# Patient Record
Sex: Female | Born: 1960 | Race: Black or African American | Hispanic: No | Marital: Single | Smoking: Never smoker
Health system: Southern US, Community
[De-identification: ages and names within clinical notes are randomized; demographics above are authoritative.]

## PROBLEM LIST (undated history)

## (undated) DIAGNOSIS — I10 Essential (primary) hypertension: Secondary | ICD-10-CM

## (undated) DIAGNOSIS — K589 Irritable bowel syndrome without diarrhea: Secondary | ICD-10-CM

## (undated) DIAGNOSIS — D369 Benign neoplasm, unspecified site: Secondary | ICD-10-CM

## (undated) DIAGNOSIS — T7840XA Allergy, unspecified, initial encounter: Secondary | ICD-10-CM

## (undated) DIAGNOSIS — N189 Chronic kidney disease, unspecified: Secondary | ICD-10-CM

## (undated) DIAGNOSIS — M1990 Unspecified osteoarthritis, unspecified site: Secondary | ICD-10-CM

## (undated) DIAGNOSIS — E785 Hyperlipidemia, unspecified: Secondary | ICD-10-CM

## (undated) DIAGNOSIS — H53009 Unspecified amblyopia, unspecified eye: Secondary | ICD-10-CM

## (undated) HISTORY — DX: Irritable bowel syndrome, unspecified: K58.9

## (undated) HISTORY — PX: KNEE ARTHROSCOPY: SUR90

## (undated) HISTORY — DX: Unspecified amblyopia, unspecified eye: H53.009

## (undated) HISTORY — DX: Hyperlipidemia, unspecified: E78.5

## (undated) HISTORY — DX: Benign neoplasm, unspecified site: D36.9

## (undated) HISTORY — PX: COLONOSCOPY: SHX174

## (undated) HISTORY — DX: Chronic kidney disease, unspecified: N18.9

## (undated) HISTORY — DX: Unspecified osteoarthritis, unspecified site: M19.90

## (undated) HISTORY — PX: POLYPECTOMY: SHX149

## (undated) HISTORY — DX: Allergy, unspecified, initial encounter: T78.40XA

---

## 1997-08-08 HISTORY — PX: CHOLECYSTECTOMY: SHX55

## 1998-03-17 ENCOUNTER — Ambulatory Visit (HOSPITAL_COMMUNITY): Admission: RE | Admit: 1998-03-17 | Discharge: 1998-03-18 | Payer: Self-pay | Admitting: General Surgery

## 1999-05-13 ENCOUNTER — Encounter: Payer: Self-pay | Admitting: Obstetrics and Gynecology

## 1999-05-13 ENCOUNTER — Inpatient Hospital Stay (HOSPITAL_COMMUNITY): Admission: AD | Admit: 1999-05-13 | Discharge: 1999-06-05 | Payer: Self-pay | Admitting: Obstetrics & Gynecology

## 1999-05-15 ENCOUNTER — Encounter: Payer: Self-pay | Admitting: Obstetrics and Gynecology

## 1999-05-17 ENCOUNTER — Encounter: Payer: Self-pay | Admitting: Obstetrics and Gynecology

## 1999-05-19 ENCOUNTER — Encounter: Payer: Self-pay | Admitting: Obstetrics and Gynecology

## 1999-05-20 ENCOUNTER — Encounter: Payer: Self-pay | Admitting: Obstetrics & Gynecology

## 1999-05-20 ENCOUNTER — Encounter: Payer: Self-pay | Admitting: Obstetrics and Gynecology

## 1999-05-22 ENCOUNTER — Encounter: Payer: Self-pay | Admitting: Obstetrics and Gynecology

## 1999-05-26 ENCOUNTER — Encounter: Payer: Self-pay | Admitting: Obstetrics & Gynecology

## 1999-05-29 ENCOUNTER — Encounter: Payer: Self-pay | Admitting: Obstetrics & Gynecology

## 1999-05-31 ENCOUNTER — Encounter: Payer: Self-pay | Admitting: Obstetrics & Gynecology

## 1999-06-06 ENCOUNTER — Encounter: Admission: RE | Admit: 1999-06-06 | Discharge: 1999-09-04 | Payer: Self-pay | Admitting: Obstetrics and Gynecology

## 2000-10-19 ENCOUNTER — Other Ambulatory Visit: Admission: RE | Admit: 2000-10-19 | Discharge: 2000-10-19 | Payer: Self-pay | Admitting: Obstetrics & Gynecology

## 2002-01-29 ENCOUNTER — Other Ambulatory Visit: Admission: RE | Admit: 2002-01-29 | Discharge: 2002-01-29 | Payer: Self-pay | Admitting: Obstetrics & Gynecology

## 2003-02-21 ENCOUNTER — Other Ambulatory Visit: Admission: RE | Admit: 2003-02-21 | Discharge: 2003-02-21 | Payer: Self-pay | Admitting: Obstetrics & Gynecology

## 2003-12-01 ENCOUNTER — Emergency Department (HOSPITAL_COMMUNITY): Admission: EM | Admit: 2003-12-01 | Discharge: 2003-12-01 | Payer: Self-pay | Admitting: Family Medicine

## 2004-03-23 ENCOUNTER — Other Ambulatory Visit: Admission: RE | Admit: 2004-03-23 | Discharge: 2004-03-23 | Payer: Self-pay | Admitting: Obstetrics & Gynecology

## 2005-04-18 ENCOUNTER — Other Ambulatory Visit: Admission: RE | Admit: 2005-04-18 | Discharge: 2005-04-18 | Payer: Self-pay | Admitting: Obstetrics & Gynecology

## 2007-01-09 ENCOUNTER — Emergency Department (HOSPITAL_COMMUNITY): Admission: EM | Admit: 2007-01-09 | Discharge: 2007-01-09 | Payer: Self-pay | Admitting: Emergency Medicine

## 2007-05-30 ENCOUNTER — Encounter: Admission: RE | Admit: 2007-05-30 | Discharge: 2007-05-30 | Payer: Self-pay | Admitting: Obstetrics & Gynecology

## 2007-08-09 DIAGNOSIS — D369 Benign neoplasm, unspecified site: Secondary | ICD-10-CM

## 2007-08-09 HISTORY — DX: Benign neoplasm, unspecified site: D36.9

## 2007-10-02 ENCOUNTER — Encounter: Admission: RE | Admit: 2007-10-02 | Discharge: 2007-10-02 | Payer: Self-pay | Admitting: Obstetrics & Gynecology

## 2007-12-14 ENCOUNTER — Emergency Department (HOSPITAL_COMMUNITY): Admission: EM | Admit: 2007-12-14 | Discharge: 2007-12-14 | Payer: Self-pay | Admitting: Family Medicine

## 2007-12-21 ENCOUNTER — Telehealth: Payer: Self-pay | Admitting: Internal Medicine

## 2008-01-23 DIAGNOSIS — M545 Low back pain: Secondary | ICD-10-CM

## 2008-01-23 DIAGNOSIS — M129 Arthropathy, unspecified: Secondary | ICD-10-CM | POA: Insufficient documentation

## 2008-01-23 DIAGNOSIS — R1012 Left upper quadrant pain: Secondary | ICD-10-CM | POA: Insufficient documentation

## 2008-01-24 ENCOUNTER — Ambulatory Visit: Payer: Self-pay | Admitting: Internal Medicine

## 2008-01-24 DIAGNOSIS — Z9089 Acquired absence of other organs: Secondary | ICD-10-CM

## 2008-02-14 ENCOUNTER — Ambulatory Visit: Payer: Self-pay | Admitting: Internal Medicine

## 2008-02-14 ENCOUNTER — Encounter: Payer: Self-pay | Admitting: Internal Medicine

## 2008-02-16 ENCOUNTER — Encounter: Payer: Self-pay | Admitting: Internal Medicine

## 2008-07-08 ENCOUNTER — Encounter: Admission: RE | Admit: 2008-07-08 | Discharge: 2008-07-08 | Payer: Self-pay | Admitting: Obstetrics & Gynecology

## 2009-07-28 ENCOUNTER — Encounter: Admission: RE | Admit: 2009-07-28 | Discharge: 2009-07-28 | Payer: Self-pay | Admitting: Obstetrics & Gynecology

## 2010-07-29 ENCOUNTER — Encounter
Admission: RE | Admit: 2010-07-29 | Discharge: 2010-07-29 | Payer: Self-pay | Source: Home / Self Care | Attending: Obstetrics & Gynecology | Admitting: Obstetrics & Gynecology

## 2010-08-29 ENCOUNTER — Encounter: Payer: Self-pay | Admitting: Obstetrics & Gynecology

## 2011-05-26 LAB — POCT URINALYSIS DIP (DEVICE)
Hgb urine dipstick: NEGATIVE
Operator id: 235561
Protein, ur: NEGATIVE
Specific Gravity, Urine: 1.01
Urobilinogen, UA: 0.2

## 2011-08-10 ENCOUNTER — Other Ambulatory Visit: Payer: Self-pay | Admitting: Obstetrics & Gynecology

## 2011-08-10 DIAGNOSIS — Z1231 Encounter for screening mammogram for malignant neoplasm of breast: Secondary | ICD-10-CM

## 2011-09-06 ENCOUNTER — Ambulatory Visit
Admission: RE | Admit: 2011-09-06 | Discharge: 2011-09-06 | Disposition: A | Discharge status: Home / Self Care | Payer: BC Managed Care – PPO | Source: Ambulatory Visit | Attending: Obstetrics & Gynecology | Admitting: Obstetrics & Gynecology

## 2011-09-06 DIAGNOSIS — Z1231 Encounter for screening mammogram for malignant neoplasm of breast: Secondary | ICD-10-CM

## 2011-09-30 ENCOUNTER — Emergency Department (INDEPENDENT_AMBULATORY_CARE_PROVIDER_SITE_OTHER)
Admission: EM | Admit: 2011-09-30 | Discharge: 2011-09-30 | Disposition: A | Discharge status: Home / Self Care | Payer: BC Managed Care – PPO | Source: Home / Self Care | Attending: Emergency Medicine | Admitting: Emergency Medicine

## 2011-09-30 ENCOUNTER — Encounter (HOSPITAL_COMMUNITY): Payer: Self-pay

## 2011-09-30 DIAGNOSIS — J329 Chronic sinusitis, unspecified: Secondary | ICD-10-CM

## 2011-09-30 HISTORY — DX: Essential (primary) hypertension: I10

## 2011-09-30 MED ORDER — AMOXICILLIN 500 MG PO CAPS: 500.0000 mg | ORAL_CAPSULE | Freq: Three times a day (TID) | ORAL | Status: AC

## 2011-09-30 NOTE — Discharge Instructions (Signed)

## 2011-09-30 NOTE — ED Notes (Signed)
C/o sharp stabbing pain in face, dental pain and thick,yellow nasal secretions

## 2011-09-30 NOTE — ED Provider Notes (Signed)
Marie Barker is a 51 y.o. female who presents to Urgent Care today for congestion maxillary sinus pain yellow nasal discharge tooth pain for one week. Symptoms initially started with cough and were resolving until 2 days ago when this pain nasal congestion and drainage started. This is consistent with previous episodes of sinus infections. Otherwise she feels well. She denies any difficulty breathing or abdominal pain.   PMH reviewed.  ROS as above otherwise neg Medications reviewed. No current facility-administered medications for this encounter.   Current Outpatient Prescriptions  Medication Sig Dispense Refill  . atenolol (TENORMIN) 50 MG tablet Take 50 mg by mouth daily.      Marland Kitchen amoxicillin (AMOXIL) 500 MG capsule Take 1 capsule (500 mg total) by mouth 3 (three) times daily.  21 capsule  0    Exam:  BP 147/80  Pulse 87  Temp(Src) 98.9 F (37.2 C) (Oral)  Resp 20  SpO2 100%  LMP 09/15/2011 Gen: Well NAD HEENT: EOMI,  MMM normal tympanic membranes bilaterally tender to palpation over left maxillary sinus Lungs: CTABL Nl WOB Heart: RRR no MRG Abd: NABS, NT, ND Exts: Non edematous BL  LE, warm and well perfused.   Assessment and Plan: 51 year old woman with likely sinusitis. Plan to treat with amoxicillin for one week. Handout provided. Presented to primary care provider if not improved in one week.     Clementeen Graham, MD 09/30/11 2053

## 2012-09-24 ENCOUNTER — Other Ambulatory Visit: Payer: Self-pay | Admitting: Obstetrics & Gynecology

## 2012-09-24 DIAGNOSIS — Z1231 Encounter for screening mammogram for malignant neoplasm of breast: Secondary | ICD-10-CM

## 2012-10-19 ENCOUNTER — Ambulatory Visit: Payer: BC Managed Care – PPO

## 2012-10-26 ENCOUNTER — Ambulatory Visit
Admission: RE | Admit: 2012-10-26 | Discharge: 2012-10-26 | Disposition: A | Discharge status: Home / Self Care | Payer: BC Managed Care – PPO | Source: Ambulatory Visit | Attending: Obstetrics & Gynecology | Admitting: Obstetrics & Gynecology

## 2012-10-26 DIAGNOSIS — Z1231 Encounter for screening mammogram for malignant neoplasm of breast: Secondary | ICD-10-CM

## 2012-11-30 ENCOUNTER — Encounter: Payer: Self-pay | Admitting: Internal Medicine

## 2012-12-03 ENCOUNTER — Encounter: Payer: Self-pay | Admitting: *Deleted

## 2013-01-22 ENCOUNTER — Encounter: Payer: Self-pay | Admitting: Internal Medicine

## 2013-01-22 ENCOUNTER — Ambulatory Visit (INDEPENDENT_AMBULATORY_CARE_PROVIDER_SITE_OTHER): Payer: BC Managed Care – PPO | Admitting: Internal Medicine

## 2013-01-22 VITALS — BP 100/72 | HR 60 | Ht 62.0 in | Wt 241.0 lb

## 2013-01-22 DIAGNOSIS — Z86010 Personal history of colonic polyps: Secondary | ICD-10-CM

## 2013-01-22 DIAGNOSIS — K589 Irritable bowel syndrome without diarrhea: Secondary | ICD-10-CM

## 2013-01-22 MED ORDER — PEG-KCL-NACL-NASULF-NA ASC-C 100 G PO SOLR: 1.0000 | Freq: Once | ORAL | Status: DC

## 2013-01-22 MED ORDER — DICYCLOMINE HCL 20 MG PO TABS: ORAL_TABLET | ORAL | Status: DC

## 2013-01-22 NOTE — Patient Instructions (Addendum)
You have been scheduled for a colonoscopy with propofol. Please follow written instructions given to you at your visit today.  Please pick up your prep kit at the pharmacy within the next 1-3 days. If you use inhalers (even only as needed), please bring them with you on the day of your procedure. Your physician has requested that you go to www.startemmi.com and enter the access code given to you at your visit today. This web site gives a general overview about your procedure. However, you should still follow specific instructions given to you by our office regarding your preparation for the procedure.  We have sent the following medications to your pharmacy for you to pick up at your convenience: Bentyl.  Start over the [employer] with one scoop with breakfast every day.  Dr Elmore Guise

## 2013-01-22 NOTE — Progress Notes (Signed)
Marie Barker [birth year] MRN [id number]        History of Present Illness:  This is a 52 year old African American female with the frequent diarrhea, urgent bowel movements occurring usually postprandially but also independent of meals. We have seen her in 2009 for screening colonoscopy which showed adenomatous polyps. She was diagnosed with irritable bowel syndrome and was given Bentyl 10 mg. She has a history of  cholelithiasis. She denies rectal bleeding. There is no family history of colon cancer or colitis. 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 She has a 44 year old daughter who is a talented Horticulturist, commercial and she travels with her.  She denies Constipation. There is a history of cholecystectomy in 1999.   Past Medical History  Diagnosis Date  . Hypertension   . Chronic kidney disease     pt denies 6/17/14sd  . Arthritis   . Tubular adenoma 2009  . IBS (irritable bowel syndrome)    Past Surgical History  Procedure Laterality Date  . Cholecystectomy  1999  . Knee arthroscopy Right   . Cesarean section      reports that she has never smoked. She has never used smokeless tobacco. She reports that  drinks alcohol. She reports that she does not use illicit drugs. family history includes Asthma in her sister; Colon cancer in her cousin; Heart disease in her brother; and Hypertension in her mother and sister. Allergies  Allergen Reactions  . Aspirin Nausea Only        Review of Systems:negative for nausea vomiting heartburn   The remainder of the 10 point ROS is negative except as outlined in H&P   Physical Exam: General appearance  Well developed, in no distress. overweight  Eyes- non icteric. HEENT nontraumatic, normocephalic. Mouth no lesions, tongue papillated, no cheilosis. Neck supple without adenopathy, thyroid not enlarged, no carotid bruits, no JVD. Lungs Clear to auscultation bilaterally. Cor normal  S1, normal S2, regular rhythm, no murmur,  quiet precordium. Abdomen:  obese soft with normal active bowel sounds. No tenderness. No distention Rectal: normal rectal sphincter tone. Soft brown Hemoccult negative stool  Extremities no pedal edema. Skin no lesions. Neurological alert and oriented x 3. Psychological normal mood and affect.  Assessment and Plan:   52 year old African American female with irritable bowel syndrome with predominant diarrhea,. This was diagnosed 5 years ago when we initially saw her. She will start Bentyl 20 mg every morning and when necessary twice a day. She will also start on Benefiber one scoop every morning. We have discussed high-fiber low carbohydrate diet. Post cholecystectomy state may be a contributing factor  History of adenomatous polyps of the colon. Last colonoscopy July 2009. She is due for recall colonoscopy which be scheduled today   01/22/2013 [name]

## 2013-02-20 ENCOUNTER — Encounter: Payer: BC Managed Care – PPO | Admitting: Internal Medicine

## 2013-03-25 ENCOUNTER — Encounter: Payer: Self-pay | Admitting: Internal Medicine

## 2013-03-25 ENCOUNTER — Ambulatory Visit (AMBULATORY_SURGERY_CENTER): Payer: BC Managed Care – PPO | Admitting: Internal Medicine

## 2013-03-25 VITALS — BP 106/58 | HR 58 | Temp 97.7°F | Resp 24 | Ht 62.0 in | Wt 241.0 lb

## 2013-03-25 DIAGNOSIS — Z86010 Personal history of colonic polyps: Secondary | ICD-10-CM

## 2013-03-25 DIAGNOSIS — D126 Benign neoplasm of colon, unspecified: Secondary | ICD-10-CM

## 2013-03-25 MED ORDER — SODIUM CHLORIDE 0.9 % IV SOLN: 500.0000 mL | INTRAVENOUS | Status: DC

## 2013-03-25 NOTE — Progress Notes (Signed)
A/ox3 pleased with MAC, report to Robbin RN 

## 2013-03-25 NOTE — Progress Notes (Signed)
Called to room to assist during endoscopic procedure.  Patient ID and intended procedure confirmed with present staff. Received instructions for my participation in the procedure from the performing physician.  

## 2013-03-25 NOTE — Patient Instructions (Addendum)
Colon polyp x 1 removed today. Hold aspirin,advil,aleve,or aspirin containing medications for 2 weeks. Call us with any questions or concerns. Thank you!!!  YOU HAD AN ENDOSCOPIC PROCEDURE TODAY AT THE [facility]: Refer to the procedure report that was given to you for any specific questions about what was found during the examination.  If the procedure report does not answer your questions, please call your gastroenterologist to clarify.  If you requested that your care partner not be given the details of your procedure findings, then the procedure report has been included in a sealed envelope for you to review at your convenience later.  YOU SHOULD EXPECT: Some feelings of bloating in the abdomen. Passage of more gas than usual.  Walking can help get rid of the air that was put into your GI tract during the procedure and reduce the bloating. If you had a lower endoscopy (such as a colonoscopy or flexible sigmoidoscopy) you may notice spotting of blood in your stool or on the toilet paper. If you underwent a bowel prep for your procedure, then you may not have a normal bowel movement for a few days.  DIET: Your first meal following the procedure should be a light meal and then it is ok to progress to your normal diet.  A half-sandwich or bowl of soup is an example of a good first meal.  Heavy or fried foods are harder to digest and may make you feel nauseous or bloated.  Likewise meals heavy in dairy and vegetables can cause extra gas to form and this can also increase the bloating.  Drink plenty of fluids but you should avoid alcoholic beverages for 24 hours.  ACTIVITY: Your care partner should take you home directly after the procedure.  You should plan to take it easy, moving slowly for the rest of the day.  You can resume normal activity the day after the procedure however you should NOT DRIVE or use heavy machinery for 24 hours (because of the sedation medicines used during the test).     SYMPTOMS TO REPORT IMMEDIATELY: A gastroenterologist can be reached at any hour.  During normal business hours, 8:30 AM to 5:00 PM Monday through Friday, call [phone].  After hours and on weekends, please call the GI answering service at [phone] who will take a message and have the physician on call contact you.   Following lower endoscopy (colonoscopy or flexible sigmoidoscopy):  Excessive amounts of blood in the stool  Significant tenderness or worsening of abdominal pains  Swelling of the abdomen that is new, acute  Fever of 100F or higher  Following upper endoscopy (EGD)  Vomiting of blood or coffee ground material  New chest pain or pain under the shoulder blades  Painful or persistently difficult swallowing  New shortness of breath  Fever of 100F or higher  Black, tarry-looking stools  FOLLOW UP: If any biopsies were taken you will be contacted by phone or by letter within the next 1-3 weeks.  Call your gastroenterologist if you have not heard about the biopsies in 3 weeks.  Our staff will call the home number listed on your records the next business day following your procedure to check on you and address any questions or concerns that you may have at that time regarding the information given to you following your procedure. This is a courtesy call and so if there is no answer at the home number and we have not heard from you through the  emergency physician on call, we will assume that you have returned to your regular daily activities without incident.  SIGNATURES/CONFIDENTIALITY: You and/or your care partner have signed paperwork which will be entered into your electronic medical record.  These signatures attest to the fact that that the information above on your After Visit Summary has been reviewed and is understood.  Full responsibility of the confidentiality of this discharge information lies with you and/or your care-partner.

## 2013-03-25 NOTE — Progress Notes (Signed)
Patient did not experience any of the following events: a burn prior to discharge; a fall within the facility; wrong site/side/patient/procedure/implant event; or a hospital transfer or hospital admission upon discharge from the facility. (G8907) Patient did not have preoperative order for IV antibiotic SSI prophylaxis. (G8918)  

## 2013-03-25 NOTE — Op Note (Signed)
[facility] [address] [place], [address]   COLONOSCOPY PROCEDURE REPORT  PATIENT: Marie Barker, Marie Barker  MR#: [id number] BIRTHDATE: 10-16-1960 , 51  yrs. old GENDER: Female ENDOSCOPIST: Hart Carwin, MD REFERRED XB:[id number] Chilton Si, M.D. PROCEDURE DATE:  03/25/2013 PROCEDURE:   Colonoscopy with snare polypectomy First Screening Colonoscopy - Avg.  risk and is 50 yrs.  old or older - No.  Prior Negative Screening - Now for repeat screening. N/A  History of Adenoma - Now for follow-up colonoscopy & has been > or = to 3 yrs.  Yes hx of adenoma.  Has been 3 or more years since last colonoscopy.  Polyps Removed Today? Yes. ASA CLASS:   Class II INDICATIONS:Patient's personal history of adenomatous colon polyps. , cousin with colon cancer MEDICATIONS: MAC sedation, administered by CRNA and propofol (Diprivan) 200mg  IV  DESCRIPTION OF PROCEDURE:   After the risks benefits and alternatives of the procedure were thoroughly explained, informed consent was obtained.  A digital rectal exam revealed no abnormalities of the rectum.   The LB [id number]-[id number]  endoscope was introduced through the anus and advanced to the cecum, which was identified by both the appendix and ileocecal valve. No adverse events experienced.   The quality of the prep was good, using MoviPrep  The instrument was then slowly withdrawn as the colon was fully examined.      COLON FINDINGS: A sessile polyp ranging between 3-54mm in size with a friable surface was found at the cecum.  A polypectomy was performed with a cold snare.  The resection was complete and the polyp tissue was completely retrieved.  Retroflexed views revealed no abnormalities. The time to cecum=3 minutes 27 seconds. Withdrawal time=8 minutes 5 seconds.  The scope was withdrawn and the procedure completed. COMPLICATIONS: There were no complications.  ENDOSCOPIC IMPRESSION: Sessile polyp ranging between 3-39mm in size was found at  the cecum; polypectomy was performed with a cold snare  RECOMMENDATIONS: 1.  Await pathology results 2.   no ASA x 2 weeks   eSigned:  Hart Carwin, MD 03/25/2013 10:35 AM   cc:   PATIENT NAME:  [name], [name] MR#: [id number]

## 2013-03-25 NOTE — Progress Notes (Signed)
No egg or soy allergy. ewm No problems with past sedation. ewm 

## 2013-03-26 ENCOUNTER — Telehealth: Payer: Self-pay | Admitting: *Deleted

## 2013-03-26 NOTE — Telephone Encounter (Signed)
  Follow up Call-  Call back number 03/25/2013  Post procedure Call Back phone  # [phone]  Permission to leave phone message Yes     Patient questions:  Do you have a fever, pain , or abdominal swelling? no Pain Score  0 *  Have you tolerated food without any problems? yes  Have you been able to return to your normal activities? yes  Do you have any questions about your discharge instructions: Diet   no Medications  no Follow up visit  no  Do you have questions or concerns about your Care? no  Actions: * If pain score is 4 or above: No action needed, pain <4.

## 2013-03-28 ENCOUNTER — Encounter: Payer: BC Managed Care – PPO | Admitting: Internal Medicine

## 2013-03-28 ENCOUNTER — Encounter: Payer: Self-pay | Admitting: Internal Medicine

## 2014-04-11 ENCOUNTER — Other Ambulatory Visit: Payer: Self-pay

## 2014-04-11 DIAGNOSIS — Z1231 Encounter for screening mammogram for malignant neoplasm of breast: Secondary | ICD-10-CM

## 2014-04-25 ENCOUNTER — Ambulatory Visit: Payer: BC Managed Care – PPO

## 2014-04-25 ENCOUNTER — Encounter: Payer: Self-pay | Admitting: Internal Medicine

## 2014-05-08 ENCOUNTER — Ambulatory Visit
Admission: RE | Admit: 2014-05-08 | Discharge: 2014-05-08 | Disposition: A | Discharge status: Home / Self Care | Payer: 59 | Source: Ambulatory Visit

## 2014-05-08 DIAGNOSIS — Z1231 Encounter for screening mammogram for malignant neoplasm of breast: Secondary | ICD-10-CM

## 2014-05-09 ENCOUNTER — Ambulatory Visit: Payer: BC Managed Care – PPO

## 2014-06-12 ENCOUNTER — Other Ambulatory Visit: Payer: Self-pay | Admitting: Obstetrics & Gynecology

## 2014-06-13 LAB — CYTOLOGY - PAP

## 2015-06-03 ENCOUNTER — Other Ambulatory Visit: Payer: Self-pay

## 2015-06-03 DIAGNOSIS — Z1231 Encounter for screening mammogram for malignant neoplasm of breast: Secondary | ICD-10-CM

## 2015-07-06 ENCOUNTER — Other Ambulatory Visit: Payer: Self-pay | Admitting: Internal Medicine

## 2015-07-06 ENCOUNTER — Ambulatory Visit
Admission: RE | Admit: 2015-07-06 | Discharge: 2015-07-06 | Disposition: A | Discharge status: Home / Self Care | Payer: PRIVATE HEALTH INSURANCE | Source: Ambulatory Visit | Attending: Internal Medicine | Admitting: Internal Medicine

## 2015-07-06 ENCOUNTER — Ambulatory Visit: Payer: Self-pay

## 2015-07-06 DIAGNOSIS — R599 Enlarged lymph nodes, unspecified: Secondary | ICD-10-CM

## 2015-07-06 DIAGNOSIS — R590 Localized enlarged lymph nodes: Secondary | ICD-10-CM

## 2015-08-11 ENCOUNTER — Ambulatory Visit
Admission: RE | Admit: 2015-08-11 | Discharge: 2015-08-11 | Disposition: A | Discharge status: Home / Self Care | Payer: Commercial Managed Care - HMO | Source: Ambulatory Visit

## 2015-08-11 DIAGNOSIS — Z1231 Encounter for screening mammogram for malignant neoplasm of breast: Secondary | ICD-10-CM

## 2017-02-27 ENCOUNTER — Other Ambulatory Visit: Payer: Self-pay | Admitting: Obstetrics & Gynecology

## 2017-02-27 DIAGNOSIS — Z1231 Encounter for screening mammogram for malignant neoplasm of breast: Secondary | ICD-10-CM

## 2017-03-08 ENCOUNTER — Ambulatory Visit: Payer: Commercial Managed Care - HMO

## 2017-05-11 ENCOUNTER — Ambulatory Visit
Admission: RE | Admit: 2017-05-11 | Discharge: 2017-05-11 | Disposition: A | Discharge status: Home / Self Care | Payer: Commercial Managed Care - HMO | Source: Ambulatory Visit | Attending: Obstetrics & Gynecology | Admitting: Obstetrics & Gynecology

## 2017-05-11 ENCOUNTER — Encounter: Payer: Self-pay | Admitting: Radiology

## 2017-05-11 DIAGNOSIS — Z1231 Encounter for screening mammogram for malignant neoplasm of breast: Secondary | ICD-10-CM

## 2018-05-30 ENCOUNTER — Other Ambulatory Visit: Payer: Self-pay | Admitting: Obstetrics & Gynecology

## 2018-05-30 DIAGNOSIS — Z1231 Encounter for screening mammogram for malignant neoplasm of breast: Secondary | ICD-10-CM

## 2018-07-12 ENCOUNTER — Ambulatory Visit
Admission: RE | Admit: 2018-07-12 | Discharge: 2018-07-12 | Disposition: A | Discharge status: Home / Self Care | Payer: 59 | Source: Ambulatory Visit | Attending: Obstetrics & Gynecology | Admitting: Obstetrics & Gynecology

## 2018-07-12 DIAGNOSIS — Z1231 Encounter for screening mammogram for malignant neoplasm of breast: Secondary | ICD-10-CM

## 2019-07-01 ENCOUNTER — Other Ambulatory Visit: Payer: Self-pay | Admitting: Obstetrics & Gynecology

## 2019-07-01 DIAGNOSIS — Z1231 Encounter for screening mammogram for malignant neoplasm of breast: Secondary | ICD-10-CM

## 2019-08-22 ENCOUNTER — Other Ambulatory Visit: Payer: Self-pay

## 2019-08-22 ENCOUNTER — Ambulatory Visit
Admission: RE | Admit: 2019-08-22 | Discharge: 2019-08-22 | Disposition: A | Discharge status: Home / Self Care | Payer: 59 | Source: Ambulatory Visit | Attending: Obstetrics & Gynecology | Admitting: Obstetrics & Gynecology

## 2019-08-22 DIAGNOSIS — Z1231 Encounter for screening mammogram for malignant neoplasm of breast: Secondary | ICD-10-CM

## 2020-11-11 ENCOUNTER — Other Ambulatory Visit: Payer: Self-pay | Admitting: Obstetrics & Gynecology

## 2020-11-11 DIAGNOSIS — Z1231 Encounter for screening mammogram for malignant neoplasm of breast: Secondary | ICD-10-CM

## 2021-01-01 ENCOUNTER — Other Ambulatory Visit: Payer: Self-pay

## 2021-01-01 ENCOUNTER — Ambulatory Visit
Admission: RE | Admit: 2021-01-01 | Discharge: 2021-01-01 | Disposition: A | Discharge status: Home / Self Care | Payer: 59 | Source: Ambulatory Visit | Attending: Obstetrics & Gynecology | Admitting: Obstetrics & Gynecology

## 2021-01-01 DIAGNOSIS — Z1231 Encounter for screening mammogram for malignant neoplasm of breast: Secondary | ICD-10-CM

## 2021-04-23 ENCOUNTER — Other Ambulatory Visit: Payer: Self-pay

## 2021-04-23 DIAGNOSIS — I890 Lymphedema, not elsewhere classified: Secondary | ICD-10-CM

## 2021-05-03 NOTE — Progress Notes (Signed)
[facility] [place]  ASSESSMENT / PLAN: Marie Barker is a 60 y.o. female with large, pedunculated collection of skin/adipose about her left medial/posterior thigh.  This is quite bothersome to her and she would like it excised, if possible.  She has a fairly normal peripheral vascular exam with evidence of only mild, asymptomatic chronic venous insufficiency (reticular veins about her ankles bilaterally).  She has no evidence of peripheral arterial disease on exam.  She has no clinical evidence of primary lower extremity lymphedema, but does have some congestion about the area of redundant skin and fat. I will refer her to plastic surgery for consideration of excision of excess skin. I counseled her that weight loss is critical. She is understanding. Follow up with me as needed.  CHIEF COMPLAINT: left leg mass  HISTORY OF PRESENT ILLNESS: Marie Barker is a 60 y.o. female referred to clinic from Dr. Redmond Pulling for possible lymphedema.  Patient has a pedunculated fold of skin in her left thigh immediately proximal to the knee which has been increasingly bothersome to her.  She is morbidly obese and carries much of her weight about her hips and thighs. She would like the area excised. She was referred to clinic because of peau d'orange appearance of the skin concerning for lymphedema.   Past Medical History:  Diagnosis Date   Allergy    seasonal   Arthritis    Chronic kidney disease    pt denies 6/17/14sd   Hypertension    IBS (irritable bowel syndrome)    Tubular adenoma 2009    Past Surgical History:  Procedure Laterality Date   CESAREAN SECTION     CHOLECYSTECTOMY  1999   COLONOSCOPY     KNEE ARTHROSCOPY Right    POLYPECTOMY      Family History  Problem Relation Age of Onset   Colon cancer Cousin        (father's sister's daughter()   Breast cancer Cousin    Heart disease Brother    Asthma Sister    Hypertension Mother    Hypertension Sister      Social History   Socioeconomic History   Marital status: Single    Spouse name: Not on file   Number of children: 1   Years of education: Not on file   Highest education level: Not on file  Occupational History   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    Employer: [employer]   Occupation: unemployed  Tobacco Use   Smoking status: Never   Smokeless tobacco: Never  Vaping Use   Vaping Use: Never used  Substance and Sexual Activity   Alcohol use: Yes    Comment: ocassional   Drug use: No   Sexual activity: Not on file  Other Topics Concern   Not on file  Social History Narrative   Not on file   Social Determinants of Health   Financial Resource Strain: Not on file  Food Insecurity: Not on file  Transportation Needs: Not on file  Physical Activity: Not on file  Stress: Not on file  Social Connections: Not on file  Intimate Partner Violence: Not on file    Allergies  Allergen Reactions   Aspirin Nausea Only    Current Outpatient Medications  Medication Sig Dispense Refill   atenolol (TENORMIN) 50 MG tablet Take 50 mg by mouth daily.     hydrochlorothiazide (HYDRODIURIL) 25 MG tablet Take 25 mg by mouth every morning.     No current facility-administered medications for this  visit.    REVIEW OF SYSTEMS:  [X]  denotes positive finding, [ ]  denotes negative finding Cardiac  Comments:  Chest pain or chest pressure:    Shortness of breath upon exertion:    Short of breath when lying flat:    Irregular heart rhythm:        Vascular    Pain in calf, thigh, or hip brought on by ambulation:    Pain in feet at night that wakes you up from your sleep:     Blood clot in your veins:    Leg swelling:         Pulmonary    Oxygen at home:    Productive cough:     Wheezing:         Neurologic    Sudden weakness in arms or legs:     Sudden numbness in arms or legs:     Sudden onset of difficulty speaking or slurred speech:    Temporary loss of vision in one  eye:     Problems with dizziness:         Gastrointestinal    Blood in stool:     Vomited blood:         Genitourinary    Burning when urinating:     Blood in urine:        Psychiatric    Major depression:         Hematologic    Bleeding problems:    Problems with blood clotting too easily:        Skin    Rashes or ulcers:        Constitutional    Fever or chills:      PHYSICAL EXAM Vitals:   05/04/21 1250  BP: (!) 142/75  Pulse: (!) 56  Resp: 20  Temp: 98.4 F (36.9 C)  SpO2: 100%  Weight: 255 lb (115.7 kg)  Height: 5\' 2"  (1.575 m)    Constitutional: well appearing. no distress. Morbidly obese.  Neurologic: CN intact. no focal findings. no sensory loss. Psychiatric:  Mood and affect symmetric and appropriate. Eyes:  No icterus. No conjunctival pallor. Ears, nose, throat:  mucous membranes moist. Midline trachea.  Cardiac: regular rate and rhythm.  Respiratory:  unlabored. Abdominal:  soft, non-tender, non-distended.  Peripheral vascular: 2+ DP.  Extremity: trace pretibial edema. no cyanosis. no pallor.  Skin: no gangrene. no ulceration. Large pedunculated fold of skin about the left medial/posterior thigh overhanging the knee with chronic congestion about the skin. No discrete mass.  Lymphatic: no Stemmer's sign. no palpable lymphadenopathy.  PERTINENT LABORATORY AND RADIOLOGIC DATA Lower Venous Reflux Study   Patient Name:  VIRDA BETTERS  Date of Exam:   05/04/2021  Medical Rec #: [id number]        Accession #:    [id number]  Date of Birth: [birth year]       Patient Gender: F  Patient Age:   62 years  Exam Location:  [facility]  Procedure:      VAS [place] LOWER EXTREMITY VENOUS REFLUX  Referring Phys: Monica Martinez    ---------------------------------------------------------------------------  -----     Indications: Possible lymphedema of the left thigh.     Limitations: Body habitus and poor ultrasound/tissue interface. Mass of   thigh preventing evaulation of popliteal vein.  Performing Technologist: Ronal Fear RVS, RCS      Examination Guidelines: A complete evaluation includes B-mode imaging,  spectral  Doppler, color Doppler, and power Doppler as needed  of all accessible  portions  of each vessel. Bilateral testing is considered an integral part of a  complete  examination. Limited examinations for reoccurring indications may be  performed  as noted. The reflux portion of the exam is performed with the patient in  reverse Trendelenburg.  Significant venous reflux is defined as >500 ms in the superficial venous  system, and >1 second in the deep venous system.      +------------------+--------+------+-----------+-----------+---------------  ---+  LEFT              Reflux  RefluxReflux Time Diameter  Comments                               No       Yes                 cms                         +------------------+--------+------+-----------+-----------+---------------  ---+  CFV               no                                                       +------------------+--------+------+-----------+-----------+---------------  ---+  FV mid            no                                                       +------------------+--------+------+-----------+-----------+---------------  ---+  Popliteal                                             not well                                                                   visualized           +------------------+--------+------+-----------+-----------+---------------  ---+  GSV at [facility]                 yes    >500 ms     0.80                         +------------------+--------+------+-----------+-----------+---------------  ---+  GSV prox thigh    no                          0.40                         +------------------+--------+------+-----------+-----------+---------------  ---+  GSV  mid thigh     no  0.36                         +------------------+--------+------+-----------+-----------+---------------  ---+  GSV dist thigh    no                          0.44                         +------------------+--------+------+-----------+-----------+---------------  ---+  anterior accessoryno                          0.25                         +------------------+--------+------+-----------+-----------+---------------  ---+           Summary:  Left:  - No evidence of deep vein thrombosis from the common femoral through the  popliteal veins.  - No evidence of superficial venous thrombosis.  - The deep venous system is competent.  - The great saphenous vein is competent.  - The small saphenous vein is competent.  - Mass of medial lower thigh appears to be excess adipose tissue.     *See table(s) above for measurements and observations.   Yevonne Aline. Stanford Breed, MD [facility] Phone Number: [phone] 05/04/2021 3:20 PM  Total time spent on preparing this encounter including chart review, data review, collecting history, examining the patient, coordinating care for this new patient, 45 minutes.  Portions of this report may have been transcribed using voice recognition software.  Every effort has been made to ensure accuracy; however, inadvertent computerized transcription errors may still be present.

## 2021-05-04 ENCOUNTER — Ambulatory Visit (HOSPITAL_COMMUNITY)
Admission: RE | Admit: 2021-05-04 | Discharge: 2021-05-04 | Disposition: A | Discharge status: Home / Self Care | Payer: BLUE CROSS/BLUE SHIELD | Source: Ambulatory Visit | Attending: Vascular Surgery | Admitting: Vascular Surgery

## 2021-05-04 ENCOUNTER — Encounter: Payer: Self-pay | Admitting: Vascular Surgery

## 2021-05-04 ENCOUNTER — Ambulatory Visit: Payer: BLUE CROSS/BLUE SHIELD | Admitting: Vascular Surgery

## 2021-05-04 ENCOUNTER — Other Ambulatory Visit: Payer: Self-pay

## 2021-05-04 VITALS — BP 142/75 | HR 56 | Temp 98.4°F | Resp 20 | Ht 62.0 in | Wt 255.0 lb

## 2021-05-04 DIAGNOSIS — L987 Excessive and redundant skin and subcutaneous tissue: Secondary | ICD-10-CM

## 2021-05-04 DIAGNOSIS — I890 Lymphedema, not elsewhere classified: Secondary | ICD-10-CM | POA: Diagnosis present

## 2021-06-11 ENCOUNTER — Encounter: Payer: Self-pay | Admitting: Plastic Surgery

## 2021-06-11 ENCOUNTER — Ambulatory Visit: Payer: BLUE CROSS/BLUE SHIELD | Admitting: Plastic Surgery

## 2021-06-11 ENCOUNTER — Other Ambulatory Visit: Payer: Self-pay

## 2021-06-11 VITALS — BP 164/83 | HR 82 | Ht 62.0 in | Wt 257.0 lb

## 2021-06-11 DIAGNOSIS — I890 Lymphedema, not elsewhere classified: Secondary | ICD-10-CM

## 2021-06-11 NOTE — Progress Notes (Signed)
   Referring Provider Michael Boston, MD [place],  [place] [address]   CC:  Left lower extremity skin excess.  Marie Barker is an 60 y.o. female.  HPI: The patient is a 60 year old with history of left lower extremity skin excess.  She is seen vascular surgery and they noted only mild chronic venous insufficiency.  She does not have a known history of lower extremity lymphedema.  She is a non-smoker and does not use any other type of tobacco.  Allergies  Allergen Reactions   Aspirin Nausea Only    Outpatient Encounter Medications as of 06/11/2021  Medication Sig   atenolol (TENORMIN) 50 MG tablet Take 50 mg by mouth daily.   hydrochlorothiazide (HYDRODIURIL) 25 MG tablet Take 25 mg by mouth every morning.   No facility-administered encounter medications on file as of 06/11/2021.     Past Medical History:  Diagnosis Date   Allergy    seasonal   Arthritis    Chronic kidney disease    pt denies 6/17/14sd   Hypertension    IBS (irritable bowel syndrome)    Tubular adenoma 2009    Past Surgical History:  Procedure Laterality Date   CESAREAN SECTION     CHOLECYSTECTOMY  1999   COLONOSCOPY     KNEE ARTHROSCOPY Right    POLYPECTOMY      Family History  Problem Relation Age of Onset   Colon cancer Cousin        (father's sister's daughter()   Breast cancer Cousin    Heart disease Brother    Asthma Sister    Hypertension Mother    Hypertension Sister     Social History   Social History Narrative   Not on file     Review of Systems General: Denies fevers, chills, weight loss CV: Denies chest pain, shortness of breath, palpitations   Physical Exam Vitals with BMI 06/11/2021 05/04/2021 03/25/2013  Height 5\' 2"  5\' 2"  -  Weight 257 lbs 255 lbs -  BMI 98.92 11.94 -  Systolic 174 081 448  Diastolic 83 75 58  Pulse 82 56 58    General:  No acute distress,  Alert and oriented, Non-Toxic, Normal speech and affect Extremities: The patient has an area  adjacent to the left knee on the medial side where she has a significant collection of adipose as well as some woody edema of the tissue.  She does not have any wounds.  She has other skin folds that are less prominent compared to the left medial leg.  Assessment/Plan 60 year old female with collection of tissue left medial leg.  I think this is most consistent with lymphedema.  I discussed that I do not think that at her current weight we should remove this because she is a high chance of developing a wound in this area.  It may be difficult to close the skin if we remove the entire area of woody edema.  She could consider compression hose or other more advanced treatments for lymphedema.  Time based coding: 16 minutes were spent with the patient.  Greater than 50% was spent on counseling cordination of care.     Lennice Sites 06/11/2021, 3:31 PM

## 2021-06-17 IMAGING — MG MM DIGITAL SCREENING BILAT W/ TOMO AND CAD
8 series · 8 of 24 positions shown · non-contrast
Comparison: Previous exam(s).

CLINICAL DATA: Screening.

EXAM:
DIGITAL SCREENING BILATERAL MAMMOGRAM WITH TOMOSYNTHESIS AND CAD
TECHNIQUE: Bilateral screening digital craniocaudal and mediolateral oblique
mammograms were obtained. Bilateral screening digital breast
tomosynthesis was performed. The images were evaluated with
computer-aided detection.

[L CC synth-2D]
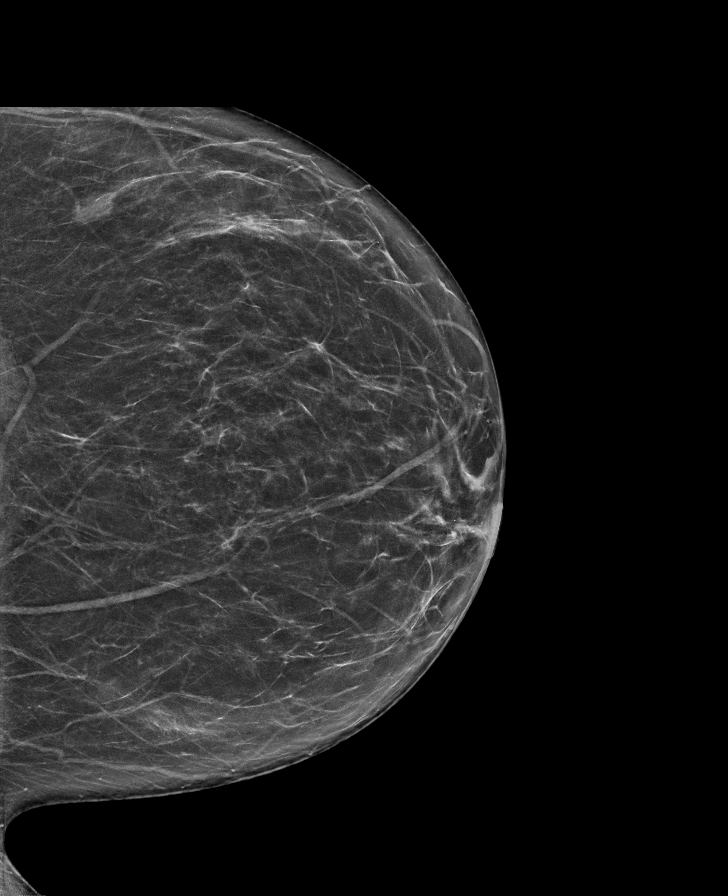

[R MLO synth-2D]
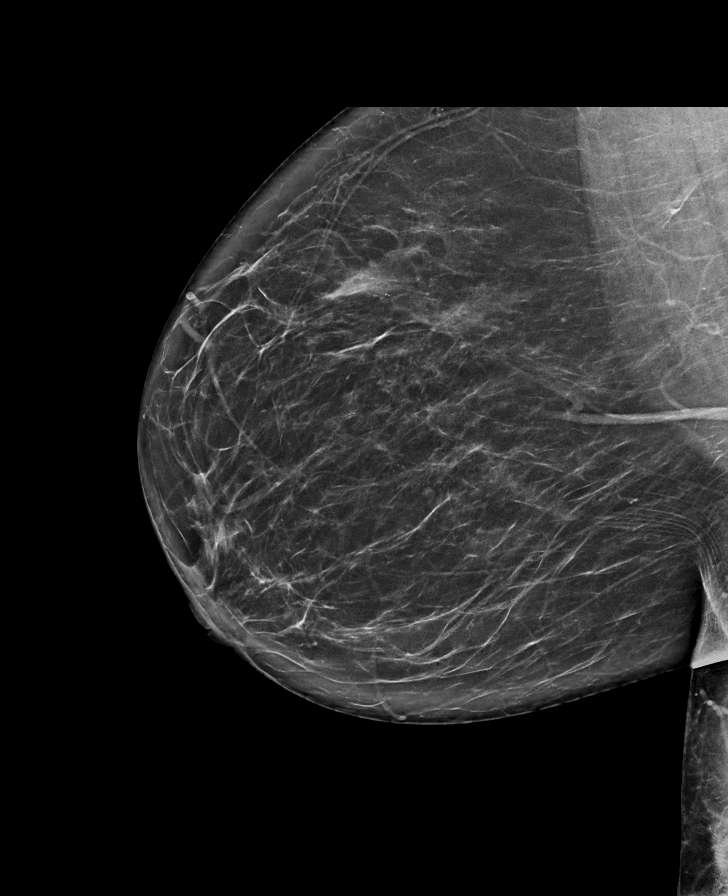

[R CC synth-2D]
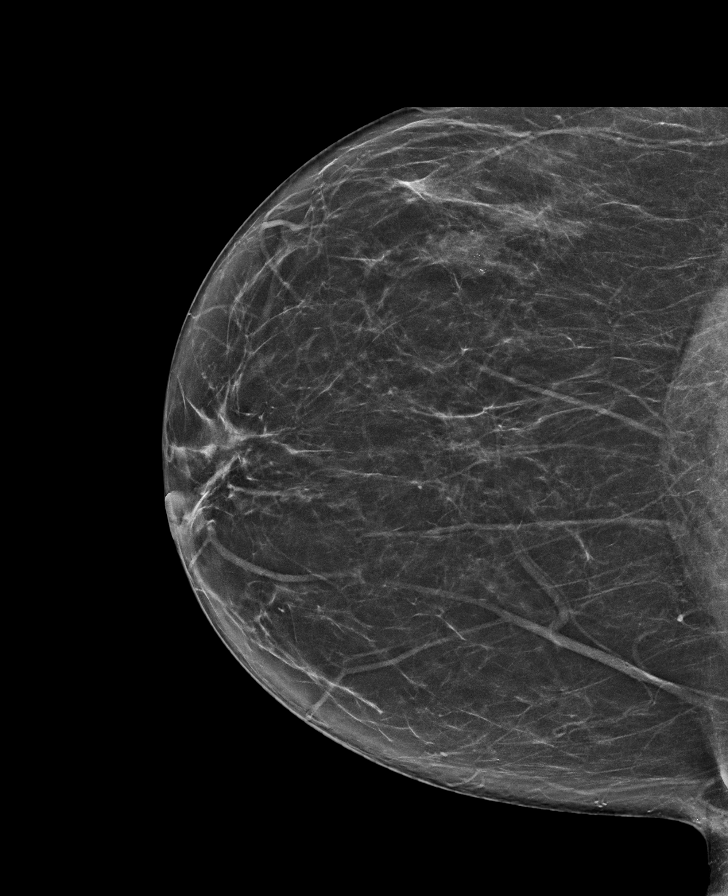

[L MLO synth-2D]
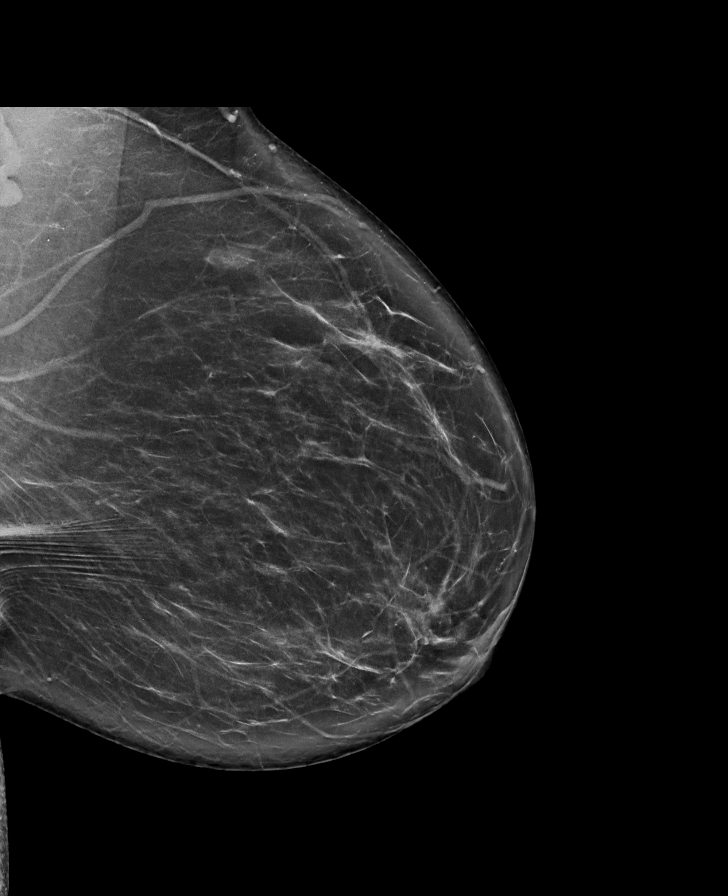

[L MLO tomo · tomo slice 46/91.0]
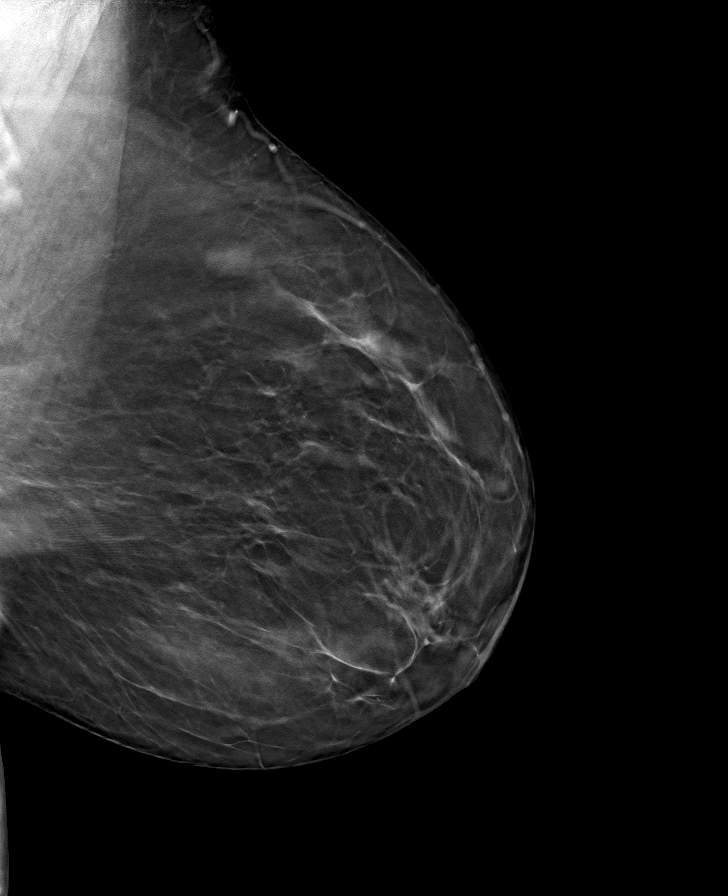

[L CC tomo · tomo slice 38/75.0]
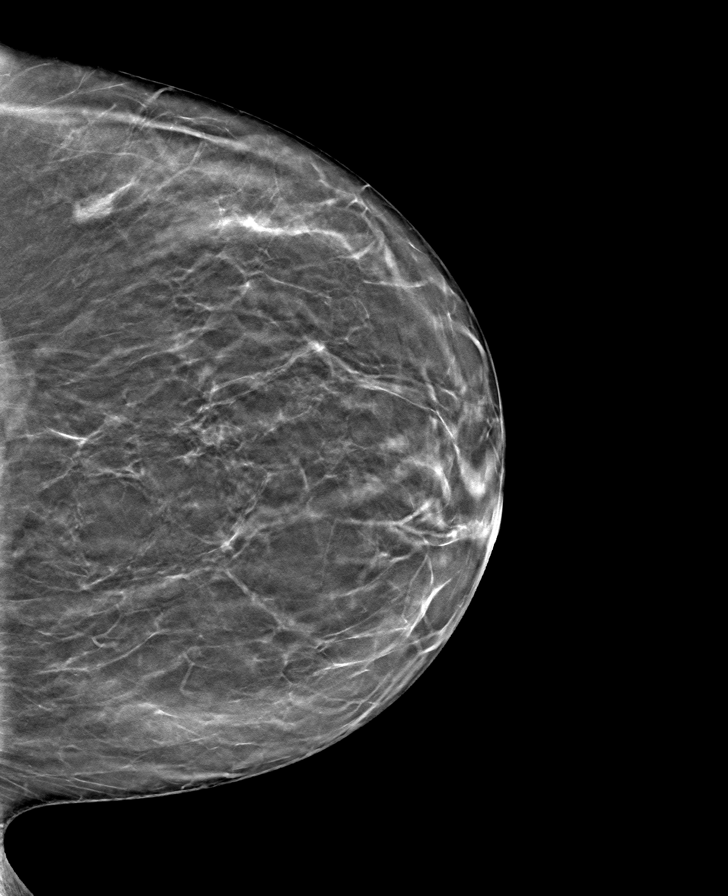

[R MLO tomo · tomo slice 42/83.0]
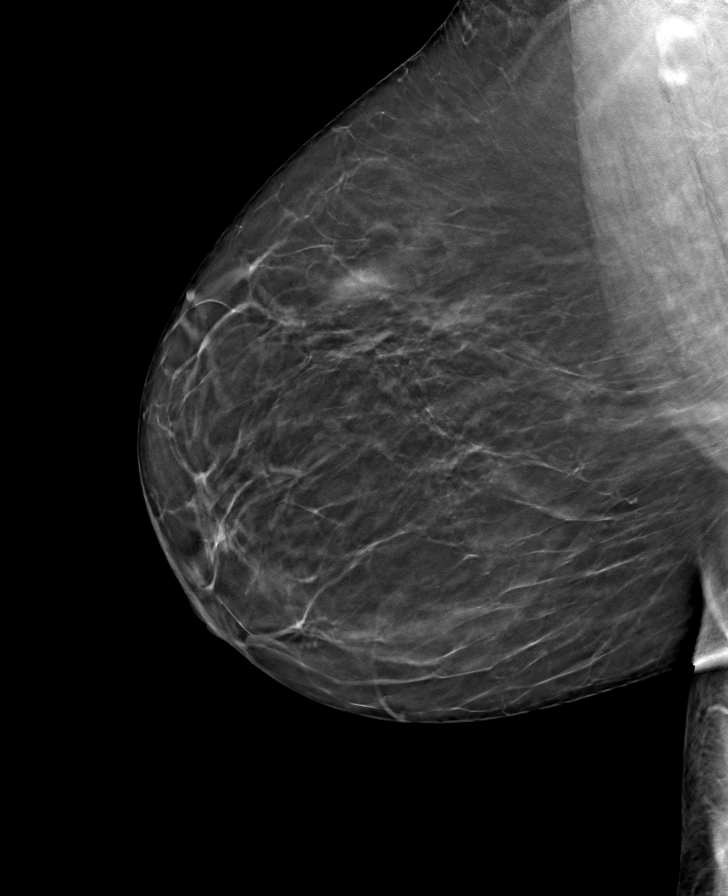

[R CC tomo · tomo slice 36/71.0]
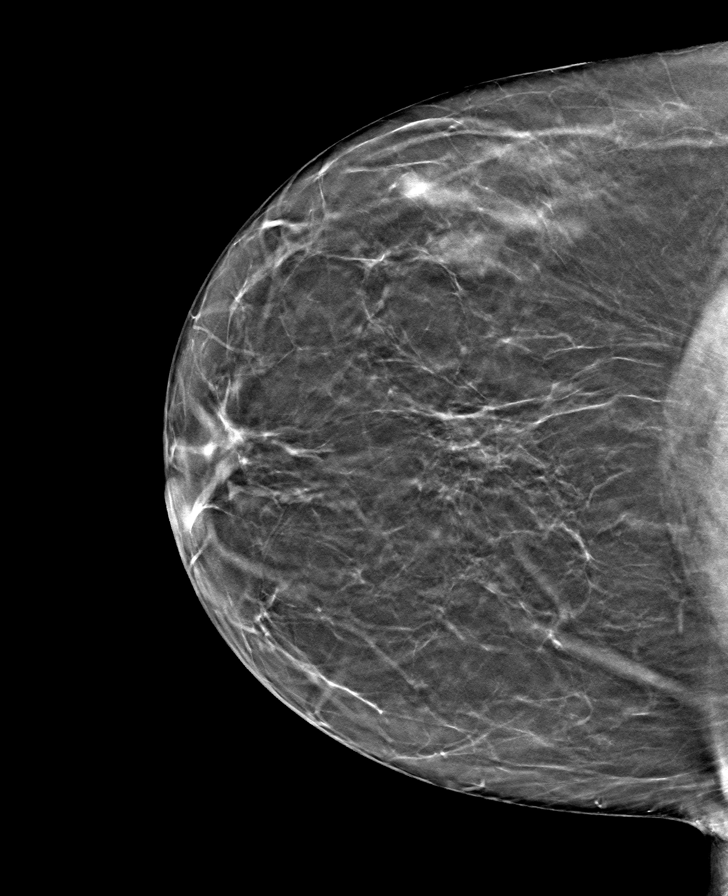

[8 of 24 positions shown; findings below may reference images not displayed]

ACR Breast Density Category b: There are scattered areas of
fibroglandular density.
FINDINGS: There are no findings suspicious for malignancy. The images were
evaluated with computer-aided detection.
IMPRESSION: No mammographic evidence of malignancy. A result letter of this
screening mammogram will be mailed directly to the patient.

RECOMMENDATION:
Screening mammogram in one year. (Code:[id number])

BI-RADS CATEGORY  1: Negative.

## 2022-02-28 ENCOUNTER — Other Ambulatory Visit: Payer: Self-pay | Admitting: Internal Medicine

## 2022-02-28 DIAGNOSIS — Z1231 Encounter for screening mammogram for malignant neoplasm of breast: Secondary | ICD-10-CM

## 2022-03-08 DIAGNOSIS — I10 Essential (primary) hypertension: Secondary | ICD-10-CM | POA: Diagnosis not present

## 2022-03-14 ENCOUNTER — Ambulatory Visit
Admission: RE | Admit: 2022-03-14 | Discharge: 2022-03-14 | Disposition: A | Discharge status: Home / Self Care | Payer: Federal, State, Local not specified - PPO | Source: Ambulatory Visit | Attending: Internal Medicine | Admitting: Internal Medicine

## 2022-03-14 DIAGNOSIS — Z1231 Encounter for screening mammogram for malignant neoplasm of breast: Secondary | ICD-10-CM

## 2022-04-08 DIAGNOSIS — J029 Acute pharyngitis, unspecified: Secondary | ICD-10-CM | POA: Diagnosis not present

## 2022-04-08 DIAGNOSIS — H6502 Acute serous otitis media, left ear: Secondary | ICD-10-CM | POA: Diagnosis not present

## 2022-04-29 DIAGNOSIS — Z01419 Encounter for gynecological examination (general) (routine) without abnormal findings: Secondary | ICD-10-CM | POA: Diagnosis not present

## 2022-04-29 DIAGNOSIS — Z6841 Body Mass Index (BMI) 40.0 and over, adult: Secondary | ICD-10-CM | POA: Diagnosis not present

## 2022-04-29 DIAGNOSIS — Z1151 Encounter for screening for human papillomavirus (HPV): Secondary | ICD-10-CM | POA: Diagnosis not present

## 2022-04-29 DIAGNOSIS — Z124 Encounter for screening for malignant neoplasm of cervix: Secondary | ICD-10-CM | POA: Diagnosis not present

## 2022-06-08 DIAGNOSIS — I10 Essential (primary) hypertension: Secondary | ICD-10-CM | POA: Diagnosis not present

## 2022-06-29 DIAGNOSIS — Z1331 Encounter for screening for depression: Secondary | ICD-10-CM | POA: Diagnosis not present

## 2022-06-29 DIAGNOSIS — Z1339 Encounter for screening examination for other mental health and behavioral disorders: Secondary | ICD-10-CM | POA: Diagnosis not present

## 2022-06-29 DIAGNOSIS — Z23 Encounter for immunization: Secondary | ICD-10-CM | POA: Diagnosis not present

## 2022-06-29 DIAGNOSIS — Z0000 Encounter for general adult medical examination without abnormal findings: Secondary | ICD-10-CM | POA: Diagnosis not present

## 2022-06-29 DIAGNOSIS — I10 Essential (primary) hypertension: Secondary | ICD-10-CM | POA: Diagnosis not present

## 2022-09-26 DIAGNOSIS — L509 Urticaria, unspecified: Secondary | ICD-10-CM | POA: Diagnosis not present

## 2023-03-14 ENCOUNTER — Other Ambulatory Visit: Payer: Self-pay | Admitting: Internal Medicine

## 2023-03-14 DIAGNOSIS — Z1231 Encounter for screening mammogram for malignant neoplasm of breast: Secondary | ICD-10-CM

## 2023-03-21 ENCOUNTER — Ambulatory Visit
Admission: RE | Admit: 2023-03-21 | Discharge: 2023-03-21 | Disposition: A | Discharge status: Home / Self Care | Payer: Federal, State, Local not specified - PPO | Source: Ambulatory Visit | Attending: Internal Medicine | Admitting: Internal Medicine

## 2023-03-21 DIAGNOSIS — Z1231 Encounter for screening mammogram for malignant neoplasm of breast: Secondary | ICD-10-CM

## 2023-05-05 DIAGNOSIS — M170 Bilateral primary osteoarthritis of knee: Secondary | ICD-10-CM | POA: Diagnosis not present

## 2023-05-30 DIAGNOSIS — M170 Bilateral primary osteoarthritis of knee: Secondary | ICD-10-CM | POA: Diagnosis not present

## 2023-06-06 ENCOUNTER — Ambulatory Visit (INDEPENDENT_AMBULATORY_CARE_PROVIDER_SITE_OTHER): Payer: Federal, State, Local not specified - PPO | Admitting: Ophthalmology

## 2023-06-06 ENCOUNTER — Encounter (INDEPENDENT_AMBULATORY_CARE_PROVIDER_SITE_OTHER): Payer: Self-pay | Admitting: Ophthalmology

## 2023-06-06 DIAGNOSIS — H4311 Vitreous hemorrhage, right eye: Secondary | ICD-10-CM | POA: Diagnosis not present

## 2023-06-06 DIAGNOSIS — H25813 Combined forms of age-related cataract, bilateral: Secondary | ICD-10-CM

## 2023-06-06 DIAGNOSIS — H35033 Hypertensive retinopathy, bilateral: Secondary | ICD-10-CM

## 2023-06-06 DIAGNOSIS — H33311 Horseshoe tear of retina without detachment, right eye: Secondary | ICD-10-CM | POA: Diagnosis not present

## 2023-06-06 DIAGNOSIS — H3561 Retinal hemorrhage, right eye: Secondary | ICD-10-CM | POA: Diagnosis not present

## 2023-06-06 DIAGNOSIS — I10 Essential (primary) hypertension: Secondary | ICD-10-CM

## 2023-06-06 MED ORDER — MAXITROL 3.5-10000-0.1 OP OINT: 1.0000 | TOPICAL_OINTMENT | Freq: Four times a day (QID) | OPHTHALMIC | 1 refills | Status: AC

## 2023-06-06 NOTE — Progress Notes (Signed)
[facility] - Clinic Note  06/06/2023   CHIEF COMPLAINT Patient presents for Retina Evaluation  HISTORY OF PRESENT ILLNESS: Marie Barker is a 62 y.o. female who presents to the clinic today for:  HPI     Retina Evaluation   In right eye.  This started 2 days ago.  Duration of 2 days.  Associated Symptoms Floaters.  Context:  distance vision, mid-range vision and near vision.  I, the attending physician,  performed the HPI with the patient and updated documentation appropriately.        Comments   Retina eval pt started having blurred vision in the right eye on Sunday she had started seeing floaters a few weeks before that she saw Dr Harriette Bouillon today and was told she may have a heme in OD       Last edited by Rennis Chris, MD on 06/06/2023  4:42 PM.    Patient states 2 days ago the vision in the right eye became blurry. The last few weeks she has been seeing floaters. She is seeing a line in her vision.  Referring physician: [facility] [place], [place] [address] [place],  [place] [address]  HISTORICAL INFORMATION:  Selected notes from the MEDICAL RECORD NUMBER Referred by Dr. Harriette Bouillon for vitreous hemorrhage OD LEE:  Ocular Hx- PMH-   CURRENT MEDICATIONS: Current Outpatient Medications (Ophthalmic Drugs)  Medication Sig   neomycin-polymyxin-dexameth (MAXITROL) 0.1 % OINT Place 1 Application into the right eye 4 (four) times daily.   No current facility-administered medications for this visit. (Ophthalmic Drugs)   Current Outpatient Medications (Other)  Medication Sig   atenolol (TENORMIN) 50 MG tablet Take 50 mg by mouth daily.   hydrochlorothiazide (HYDRODIURIL) 25 MG tablet Take 25 mg by mouth every morning.   No current facility-administered medications for this visit. (Other)   REVIEW OF SYSTEMS: ROS   Positive for: Cardiovascular, Eyes Last edited by Etheleen Mayhew, COT on 06/06/2023  2:37 PM.      ALLERGIES Allergies  Allergen Reactions   Aspirin Nausea Only   PAST MEDICAL HISTORY Past Medical History:  Diagnosis Date   Allergy    seasonal   Arthritis    Chronic kidney disease    pt denies 6/17/14sd   Hypertension    IBS (irritable bowel syndrome)    Tubular adenoma 2009   Past Surgical History:  Procedure Laterality Date   CESAREAN SECTION     CHOLECYSTECTOMY  1999   COLONOSCOPY     KNEE ARTHROSCOPY Right    POLYPECTOMY     FAMILY HISTORY Family History  Problem Relation Age of Onset   Colon cancer Cousin        (father's sister's daughter()   Breast cancer Cousin    Heart disease Brother    Asthma Sister    Hypertension Mother    Hypertension Sister    SOCIAL HISTORY Social History   Tobacco Use   Smoking status: Never   Smokeless tobacco: Never  Vaping Use   Vaping status: Never Used  Substance Use Topics   Alcohol use: Yes    Comment: ocassional   Drug use: No       OPHTHALMIC EXAM:  Base Eye Exam     Visual Acuity (Snellen - Linear)       Right Left   Dist [place] CF at 3' 20/40 -1   Dist ph [place] NI 20/30    Correction: Contacts  [place] VA  OS OD amblyopic         Tonometry (Tonopen, 2:45 PM)       Right Left   Pressure 16 12         Pupils       Pupils Dark Shape   Right PERRL dilated Round   Left PERRL dilated Round         Visual Fields       Left Right    Full Full         Extraocular Movement       Right Left    Full, Ortho Full, Ortho         Neuro/Psych     Oriented x3: Yes   Mood/Affect: Normal         Dilation     Both eyes: 2.5% Phenylephrine @ 2:45 PM           Slit Lamp and Fundus Exam     External Exam       Right Left   External Normal Normal         Slit Lamp Exam       Right Left   Lids/Lashes Dermatochalasis - upper lid Dermatochalasis - upper lid   Conjunctiva/Sclera Melanosis Melanosis   Cornea 1+ Punctate epithelial erosions 1+ Punctate epithelial erosions    Anterior Chamber Deep and clear Deep and clear   Iris Round and dilated Round and dilated   Lens 2+ Nuclear sclerosis, 2-3+ Cortical cataract 2+ Nuclear sclerosis, 2-3+ Cortical cataract   Anterior Vitreous Vitreous syneresis, + RBC, dense central VH Vitreous syneresis         Fundus Exam       Right Left   Disc No view Pink and sharp, temp PPP   C/D Ratio  0.2   Macula no view Flat, Good foveal reflex, RPE mottling, no heme or edema   Vessels no view central, hazy view peripherally, Vascular attenuation Vascular attenuation, Tortuous   Periphery hazy view, grossly attached; HST at 0230 w/ bridging vessels and +heme attached; no heme           Refraction     Manifest Refraction       Sphere Cylinder Dist VA   Right      Left -3.00 Sphere 20/20-3  Unable tp correct OD           IMAGING AND PROCEDURES  Imaging and Procedures for 06/06/2023  OCT, Retina - OU - Both Eyes       Right Eye Quality was poor. Progression has no prior data.   Left Eye Quality was good. Central Foveal Thickness: 229. Progression has no prior data. Findings include normal foveal contour, no IRF, no SRF.   Notes *Images captured and stored on drive  Diagnosis / Impression:  OD: no interpretable central rasters; peripheral retina grossly attached OS: NFP; no IRF/SRF   Clinical management:  See below  Abbreviations: NFP - Normal foveal profile. CME - cystoid macular edema. PED - pigment epithelial detachment. IRF - intraretinal fluid. SRF - subretinal fluid. EZ - ellipsoid zone. ERM - epiretinal membrane. ORA - outer retinal atrophy. ORT - outer retinal tubulation. SRHM - subretinal hyper-reflective material. IRHM - intraretinal hyper-reflective material      Repair Retinal Breaks, Cryotherapy - OD - Right Eye       CRYOPEXY PROCEDURE NOTE  Preoperative Diagnosis:       Retinal Tear, RIGHT EYE Postoperative Diagnosis:     Same  Surgeon:  Rennis Chris,  M.D./Ph.D. Anesthesia Type:            Subconjunctival lidocaine injection   The patient's RIGHT eye was marked and a timeout was performed to verify the correct patient, the correct site, and correct procedure. One of drop of Proparacaine was given in the operative eye. A local subconjunctival block was performed utilizing 2% Lidocaine using a 30 gauge needle from the 12-6 clock hours, nasally. A wire eyelid speculum was inserted into the operative eye. Using indirect ophthalmoloscopy, the retinal tear was identified. Cryotherapy was appropriately utilized in the areas of retinal tear at the 230 clock hour.  A small amount of Maxitrol ointment was instilled to the operative eye and the eye was patched.  The patient was given a prescription for Maxitrol gtts to be used QID in the operative eye for 7 days.          ASSESSMENT/PLAN:   ICD-10-CM   1. 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 Vitreous hemorrhage, right eye (HCC)  H43.11 OCT, Retina - OU - Both Eyes    3. Hypertensive retinopathy of both eyes  H35.033     4. Essential hypertension  I10     5. Combined forms of age-related cataract of both eyes  H25.813      1-2. Retinal tear with VH OD - The incidence, risk factors, and natural history of retinal tear was discussed with patient.   - Potential treatment options including laser retinopexy and cryotherapy discussed with patient. - HST at 0230 with bridging vessels, +heme - OCT OD poor view but grossly attached peripherally - recommend cryopexy OD today, 10.29.24 - RBA of procedure discussed, questions answered - patient would like proceed with treatment - informed consent obtained and signed 10.29.24 - see procedure note - start Maxitrol ointment QID OD -rx sent to pharmacy on file - VH precautions reviewed -- minimize activities, keep head elevated, avoid  ASA/NSAIDs/blood thinners as able  - f/u in 1 wk -- POV  3,4. Hypertensive retinopathy OU - discussed importance of tight BP control - monitor   5. Mixed Cataract OU - The symptoms of cataract, surgical options, and treatments and risks were discussed with patient. - discussed diagnosis and progression - monitor   Ophthalmic Meds Ordered this visit:  Meds ordered this encounter  Medications   neomycin-polymyxin-dexameth (MAXITROL) 0.1 % OINT    Sig: Place 1 Application into the right eye 4 (four) times daily.    Dispense:  3.5 g    Refill:  1     Return in about 1 week (around 06/13/2023) for retinal tear w/ VH OD, POV, Dilated Exam, OCT.  There are no Patient Instructions on file for this visit.  Explained the diagnoses, plan, and follow up with the patient and they expressed understanding.  Patient expressed understanding of the importance of proper follow up care.  This document serves as a record of services personally performed by Karie Chimera, MD, PhD. It was created on their behalf by Glee Arvin. Manson Passey, OA an ophthalmic technician. The creation of this record is the provider's dictation and/or activities during the visit.    Electronically signed by: Glee Arvin. Manson Passey, OA 06/06/23 4:46 PM   This document serves as a record of services personally performed by Karie Chimera, MD, PhD. It was created on  their behalf by Charlette Caffey, COT an ophthalmic technician. The creation of this record is the provider's dictation and/or activities during the visit.    Electronically signed by:  Charlette Caffey, COT  06/06/23 4:46 PM  Karie Chimera, M.D., Ph.D. Diseases & Surgery of the Retina and Vitreous [facility] 06/06/2023  I have reviewed the above documentation for accuracy and completeness, and I agree with the above. Karie Chimera, M.D., Ph.D. 06/06/23 4:47 PM   Abbreviations: M myopia (nearsighted); A astigmatism; H hyperopia  (farsighted); P presbyopia; Mrx spectacle prescription;  CTL contact lenses; OD right eye; OS left eye; OU both eyes  XT exotropia; ET esotropia; PEK punctate epithelial keratitis; PEE punctate epithelial erosions; DES dry eye syndrome; MGD meibomian gland dysfunction; ATs artificial tears; PFAT's preservative free artificial tears; NSC nuclear sclerotic cataract; PSC posterior subcapsular cataract; ERM epi-retinal membrane; PVD posterior vitreous detachment; RD retinal detachment; DM diabetes mellitus; DR diabetic retinopathy; NPDR non-proliferative diabetic retinopathy; PDR proliferative diabetic retinopathy; CSME clinically significant macular edema; DME diabetic macular edema; dbh dot blot hemorrhages; CWS cotton wool spot; POAG primary open angle glaucoma; C/D cup-to-disc ratio; HVF humphrey visual field; GVF goldmann visual field; OCT optical coherence tomography; IOP intraocular pressure; BRVO Branch retinal vein occlusion; CRVO central retinal vein occlusion; CRAO central retinal artery occlusion; BRAO branch retinal artery occlusion; RT retinal tear; SB scleral buckle; PPV pars plana vitrectomy; VH Vitreous hemorrhage; PRP panretinal laser photocoagulation; IVK intravitreal kenalog; VMT vitreomacular traction; MH Macular hole;  NVD neovascularization of the disc; NVE neovascularization elsewhere; AREDS age related eye disease study; ARMD age related macular degeneration; POAG primary open angle glaucoma; EBMD epithelial/anterior basement membrane dystrophy; ACIOL anterior chamber intraocular lens; IOL intraocular lens; PCIOL posterior chamber intraocular lens; Phaco/IOL phacoemulsification with intraocular lens placement; PRK photorefractive keratectomy; LASIK laser assisted in situ keratomileusis; HTN hypertension; DM diabetes mellitus; COPD chronic obstructive pulmonary disease

## 2023-06-09 NOTE — Progress Notes (Signed)
[facility] - Clinic Note  06/14/2023   CHIEF COMPLAINT Patient presents for Retina Follow Up  HISTORY OF PRESENT ILLNESS: Marie Barker is a 62 y.o. female who presents to the clinic today for:  HPI     Retina Follow Up   Patient presents with  Other.  In right eye.  This started 1 week ago.  I, the attending physician,  performed the HPI with the patient and updated documentation appropriately.        Comments   Patient here for 1 weeks retin follow up for s/p cryopexy OD 06-06-23. RT w VH. Patient states vision has come back. Has floaters and squiggly lines OD. No eye pain. A little itchy. Done with ung.  Usually wears CL OU.      Last edited by Rennis Chris, MD on 06/14/2023  4:02 PM.    Pt states she had no problems after the cryo, she is seeing floaters in her right eye  Referring physician: Melida Quitter, MD [address] [place],  [place] [address]  HISTORICAL INFORMATION:  Selected notes from the MEDICAL RECORD NUMBER Referred by Dr. Harriette Bouillon for vitreous hemorrhage OD LEE:  Ocular Hx- PMH-   CURRENT MEDICATIONS: Current Outpatient Medications (Ophthalmic Drugs)  Medication Sig   prednisoLONE acetate (PRED FORTE) 1 % ophthalmic suspension Place 1 drop into the right eye 4 (four) times daily for 7 days.   neomycin-polymyxin-dexameth (MAXITROL) 0.1 % OINT Place 1 Application into the right eye 4 (four) times daily. (Patient not taking: Reported on 06/14/2023)   No current facility-administered medications for this visit. (Ophthalmic Drugs)   Current Outpatient Medications (Other)  Medication Sig   atenolol (TENORMIN) 50 MG tablet Take 50 mg by mouth daily.   hydrochlorothiazide (HYDRODIURIL) 25 MG tablet Take 25 mg by mouth every morning.   No current facility-administered medications for this visit. (Other)   REVIEW OF SYSTEMS: ROS   Positive for: Cardiovascular, Eyes Last edited by Laddie Aquas, COA on 06/14/2023  1:29 PM.       ALLERGIES Allergies  Allergen Reactions   Aspirin Nausea Only   PAST MEDICAL HISTORY Past Medical History:  Diagnosis Date   Allergy    seasonal   Arthritis    Chronic kidney disease    pt denies 6/17/14sd   Hypertension    IBS (irritable bowel syndrome)    Tubular adenoma 2009   Past Surgical History:  Procedure Laterality Date   CESAREAN SECTION     CHOLECYSTECTOMY  1999   COLONOSCOPY     KNEE ARTHROSCOPY Right    POLYPECTOMY     FAMILY HISTORY Family History  Problem Relation Age of Onset   Colon cancer Cousin        (father's sister's daughter()   Breast cancer Cousin    Heart disease Brother    Asthma Sister    Hypertension Mother    Hypertension Sister    SOCIAL HISTORY Social History   Tobacco Use   Smoking status: Never   Smokeless tobacco: Never  Vaping Use   Vaping status: Never Used  Substance Use Topics   Alcohol use: Yes    Comment: ocassional   Drug use: No       OPHTHALMIC EXAM:  Base Eye Exam     Visual Acuity (Snellen - Linear)       Right Left   Dist [place] 20/200 -2    Dist [place]  20/20 -1   Dist ph [place]  20/200 +2     Correction: Contacts  CL OS only.        Tonometry (Tonopen, 1:25 PM)       Right Left   Pressure 12 13         Pupils       Dark Light Shape React APD   Right 3 2 Round Brisk None   Left 3 2 Round Brisk None         Visual Fields (Counting fingers)       Left Right    Full Full         Extraocular Movement       Right Left    Full, Ortho Full, Ortho         Neuro/Psych     Oriented x3: Yes   Mood/Affect: Normal         Dilation     Both eyes: 1.0% Mydriacyl, 2.5% Phenylephrine @ 1:25 PM           Slit Lamp and Fundus Exam     External Exam       Right Left   External Normal Normal         Slit Lamp Exam       Right Left   Lids/Lashes Dermatochalasis - upper lid Dermatochalasis - upper lid   Conjunctiva/Sclera Melanosis, mild Subconjunctival hemorrhage  nasally -- almost resolved Melanosis   Cornea 1+ Punctate epithelial erosions 1+ Punctate epithelial erosions   Anterior Chamber Deep and clear Deep and clear   Iris Round and dilated Round and dilated   Lens 2+ Nuclear sclerosis, 2-3+ Cortical cataract 2+ Nuclear sclerosis, 2-3+ Cortical cataract   Anterior Vitreous Vitreous syneresis, + RBC, dense central VH, blood stained vitreous condensations -- turning white, improving and settling inferiorly Vitreous syneresis         Fundus Exam       Right Left   Disc No view, Pink and Sharp Pink and sharp, temp PPP   C/D Ratio 0.4 0.2   Macula hazy view, Good foveal reflex, No heme or edema Flat, Good foveal reflex, RPE mottling, no heme or edema   Vessels attenuated, Tortuous Vascular attenuation, Tortuous   Periphery HST at 0230 w/ good cryo changes surrounding -- +shallow cuff of SRF / focal RD attached; no heme           IMAGING AND PROCEDURES  Imaging and Procedures for 06/14/2023  OCT, Retina - OU - Both Eyes       Right Eye Quality was good. Central Foveal Thickness: 258. Progression has improved. Findings include normal foveal contour, no IRF, no SRF (Vitreous opacities improving and settling inferiorly).   Left Eye Quality was good. Central Foveal Thickness: 229. Progression has been stable. Findings include normal foveal contour, no IRF, no SRF.   Notes *Images captured and stored on drive  Diagnosis / Impression:  OD: Vitreous opacities improving and settling inferiorly OS: NFP; no IRF/SRF   Clinical management:  See below  Abbreviations: NFP - Normal foveal profile. CME - cystoid macular edema. PED - pigment epithelial detachment. IRF - intraretinal fluid. SRF - subretinal fluid. EZ - ellipsoid zone. ERM - epiretinal membrane. ORA - outer retinal atrophy. ORT - outer retinal tubulation. SRHM - subretinal hyper-reflective material. IRHM - intraretinal hyper-reflective material      Repair Retinal Detach,  Photocoag - OD - Right Eye       LASER PROCEDURE NOTE  Procedure:  Barrier laser retinopexy using slit  lamp laser, RIGHT eye   Diagnosis:   Retinal tear w/ +SRF / focal RD, RIGHT eye                     230 o'clock anterior to equator   Surgeon: Rennis Chris, MD, PhD  Anesthesia: Topical  Informed consent obtained, operative eye marked, and time out performed prior to initiation of laser.   Laser settings:  Lumenis Smart532 laser, slit lamp Lens: [place]ster PRP 165 Power: 280 mW Spot size: 200 microns Duration: 30 msec  # spots: 392  Placement of laser: Using a Mainster PRP 165 contact lens at the slit lamp, laser was placed in three confluent rows around retina tear w/ cuff of SRF at 0230 oclock anterior to equator with additional rows anteriorly.  Complications: None.  Patient tolerated the procedure well and received written and verbal post-procedure care information/education.           ASSESSMENT/PLAN:   ICD-10-CM   1. Retinal tear of right eye  H33.311 OCT, Retina - OU - Both Eyes    CANCELED: Repair Retinal Breaks, Laser - OD - Right Eye    2. Vitreous hemorrhage, right eye (HCC)  H43.11 OCT, Retina - OU - Both Eyes    3. Right retinal detachment  H33.21 Repair Retinal Detach, Photocoag - OD - Right Eye    4. Hypertensive retinopathy of both eyes  H35.033 OCT, Retina - OU - Both Eyes    5. Essential hypertension  I10     6. Combined forms of age-related cataract of both eyes  H25.813      1-2. Retinal tear with VH OD - HST at 0230 with bridging vessels, +heme - s/p cryopexy OD (10.29.24) - good early cryo changes around tear, but exam shows +cuff of SRF / focal RD OD - recommend laser retinopexy OD today, 11.06.24 - RBA of procedure discussed, questions answered - informed consent obtained and signed 11.06.24 - see procedure note - start PF QID OD x7 days - VH precautions reviewed -- minimize activities, keep head elevated, avoid ASA/NSAIDs/blood  thinners as able  - f/u in 1 wk -- POV  3,4. Hypertensive retinopathy OU - discussed importance of tight BP control - monitor   5. Mixed Cataract OU - The symptoms of cataract, surgical options, and treatments and risks were discussed with patient. - discussed diagnosis and progression - monitor   Ophthalmic Meds Ordered this visit:  Meds ordered this encounter  Medications   prednisoLONE acetate (PRED FORTE) 1 % ophthalmic suspension    Sig: Place 1 drop into the right eye 4 (four) times daily for 7 days.    Dispense:  5 mL    Refill:  0     Return in about 1 week (around 06/21/2023) for post-cryo and laser OD, POV, Dilated Exam, OCT.  There are no Patient Instructions on file for this visit.  Explained the diagnoses, plan, and follow up with the patient and they expressed understanding.  Patient expressed understanding of the importance of proper follow up care.  This document serves as a record of services personally performed by Karie Chimera, MD, PhD. It was created on their behalf by De Blanch, an ophthalmic technician. The creation of this record is the provider's dictation and/or activities during the visit.    Electronically signed by: De Blanch, OA, 06/14/23  4:34 PM  Karie Chimera, M.D., Ph.D. Diseases & Surgery of the Retina and Vitreous [facility] 06/14/2023  I have reviewed the above documentation for accuracy and completeness, and I agree with the above. Karie Chimera, M.D., Ph.D. 06/14/23 4:40 PM   Abbreviations: M myopia (nearsighted); A astigmatism; H hyperopia (farsighted); P presbyopia; Mrx spectacle prescription;  CTL contact lenses; OD right eye; OS left eye; OU both eyes  XT exotropia; ET esotropia; PEK punctate epithelial keratitis; PEE punctate epithelial erosions; DES dry eye syndrome; MGD meibomian gland dysfunction; ATs artificial tears; PFAT's preservative free artificial tears; NSC nuclear sclerotic  cataract; PSC posterior subcapsular cataract; ERM epi-retinal membrane; PVD posterior vitreous detachment; RD retinal detachment; DM diabetes mellitus; DR diabetic retinopathy; NPDR non-proliferative diabetic retinopathy; PDR proliferative diabetic retinopathy; CSME clinically significant macular edema; DME diabetic macular edema; dbh dot blot hemorrhages; CWS cotton wool spot; POAG primary open angle glaucoma; C/D cup-to-disc ratio; HVF humphrey visual field; GVF goldmann visual field; OCT optical coherence tomography; IOP intraocular pressure; BRVO Branch retinal vein occlusion; CRVO central retinal vein occlusion; CRAO central retinal artery occlusion; BRAO branch retinal artery occlusion; RT retinal tear; SB scleral buckle; PPV pars plana vitrectomy; VH Vitreous hemorrhage; PRP panretinal laser photocoagulation; IVK intravitreal kenalog; VMT vitreomacular traction; MH Macular hole;  NVD neovascularization of the disc; NVE neovascularization elsewhere; AREDS age related eye disease study; ARMD age related macular degeneration; POAG primary open angle glaucoma; EBMD epithelial/anterior basement membrane dystrophy; ACIOL anterior chamber intraocular lens; IOL intraocular lens; PCIOL posterior chamber intraocular lens; Phaco/IOL phacoemulsification with intraocular lens placement; PRK photorefractive keratectomy; LASIK laser assisted in situ keratomileusis; HTN hypertension; DM diabetes mellitus; COPD chronic obstructive pulmonary disease

## 2023-06-13 ENCOUNTER — Encounter (INDEPENDENT_AMBULATORY_CARE_PROVIDER_SITE_OTHER): Payer: Federal, State, Local not specified - PPO | Admitting: Ophthalmology

## 2023-06-14 ENCOUNTER — Encounter (INDEPENDENT_AMBULATORY_CARE_PROVIDER_SITE_OTHER): Payer: Self-pay | Admitting: Ophthalmology

## 2023-06-14 ENCOUNTER — Ambulatory Visit (INDEPENDENT_AMBULATORY_CARE_PROVIDER_SITE_OTHER): Payer: Federal, State, Local not specified - PPO | Admitting: Ophthalmology

## 2023-06-14 DIAGNOSIS — I10 Essential (primary) hypertension: Secondary | ICD-10-CM

## 2023-06-14 DIAGNOSIS — H4311 Vitreous hemorrhage, right eye: Secondary | ICD-10-CM

## 2023-06-14 DIAGNOSIS — H3321 Serous retinal detachment, right eye: Secondary | ICD-10-CM | POA: Diagnosis not present

## 2023-06-14 DIAGNOSIS — H33311 Horseshoe tear of retina without detachment, right eye: Secondary | ICD-10-CM

## 2023-06-14 DIAGNOSIS — H35033 Hypertensive retinopathy, bilateral: Secondary | ICD-10-CM

## 2023-06-14 DIAGNOSIS — H25813 Combined forms of age-related cataract, bilateral: Secondary | ICD-10-CM

## 2023-06-14 MED ORDER — PREDNISOLONE ACETATE 1 % OP SUSP: 1.0000 [drp] | Freq: Four times a day (QID) | OPHTHALMIC | 0 refills | Status: AC

## 2023-06-15 NOTE — Progress Notes (Signed)
[facility] - Clinic Note  06/20/2023   CHIEF COMPLAINT Patient presents for Retina Follow Up  HISTORY OF PRESENT ILLNESS: Marie Barker is a 62 y.o. female who presents to the clinic today for:  HPI     Retina Follow Up   Patient presents with  Other.  In right eye.  This started 1 week ago.  I, the attending physician,  performed the HPI with the patient and updated documentation appropriately.        Comments   Patient states vision a lot better. Still seeing some floaters but better. OD itchy a lot. No eye pain. Using eye drops.       Last edited by Rennis Chris, MD on 06/20/2023  4:56 PM.    Pt feels like her vision is clearing up, she still has a few floaters in her right eye  Referring physician: Melida Quitter, MD [address] [place],  [place] [address]  HISTORICAL INFORMATION:  Selected notes from the MEDICAL RECORD NUMBER Referred by Dr. Harriette Bouillon for vitreous hemorrhage OD LEE:  Ocular Hx- PMH-   CURRENT MEDICATIONS: Current Outpatient Medications (Ophthalmic Drugs)  Medication Sig   prednisoLONE acetate (PRED FORTE) 1 % ophthalmic suspension Place 1 drop into the right eye 4 (four) times daily for 7 days.   neomycin-polymyxin-dexameth (MAXITROL) 0.1 % OINT Place 1 Application into the right eye 4 (four) times daily. (Patient not taking: Reported on 06/14/2023)   No current facility-administered medications for this visit. (Ophthalmic Drugs)   Current Outpatient Medications (Other)  Medication Sig   atenolol (TENORMIN) 50 MG tablet Take 50 mg by mouth daily.   hydrochlorothiazide (HYDRODIURIL) 25 MG tablet Take 25 mg by mouth every morning.   No current facility-administered medications for this visit. (Other)   REVIEW OF SYSTEMS: ROS   Positive for: Cardiovascular, Eyes Last edited by Laddie Aquas, COA on 06/20/2023  3:00 PM.       ALLERGIES Allergies  Allergen Reactions   Aspirin Nausea Only   PAST MEDICAL  HISTORY Past Medical History:  Diagnosis Date   Allergy    seasonal   Arthritis    Chronic kidney disease    pt denies 6/17/14sd   Hypertension    IBS (irritable bowel syndrome)    Tubular adenoma 2009   Past Surgical History:  Procedure Laterality Date   CESAREAN SECTION     CHOLECYSTECTOMY  1999   COLONOSCOPY     KNEE ARTHROSCOPY Right    POLYPECTOMY     FAMILY HISTORY Family History  Problem Relation Age of Onset   Colon cancer Cousin        (father's sister's daughter()   Breast cancer Cousin    Heart disease Brother    Asthma Sister    Hypertension Mother    Hypertension Sister    SOCIAL HISTORY Social History   Tobacco Use   Smoking status: Never   Smokeless tobacco: Never  Vaping Use   Vaping status: Never Used  Substance Use Topics   Alcohol use: Yes    Comment: ocassional   Drug use: No       OPHTHALMIC EXAM:  Base Eye Exam     Visual Acuity (Snellen - Linear)       Right Left   Dist [place] 20/80 +1    Dist cc  20/25   Dist ph cc  NI    Correction: Contacts  Wearing CL OS only  Tonometry (Tonopen, 2:57 PM)       Right Left   Pressure 14 16         Pupils       Dark Light Shape React APD   Right 3 2 Round Brisk None   Left 3 2 Round Brisk None         Visual Fields (Counting fingers)       Left Right    Full Full         Extraocular Movement       Right Left    Full, Ortho Full, Ortho         Neuro/Psych     Oriented x3: Yes   Mood/Affect: Normal         Dilation     Both eyes: 1.0% Mydriacyl, 2.5% Phenylephrine @ 2:57 PM           Slit Lamp and Fundus Exam     External Exam       Right Left   External Normal Normal         Slit Lamp Exam       Right Left   Lids/Lashes Dermatochalasis - upper lid Dermatochalasis - upper lid   Conjunctiva/Sclera Melanosis, SCH -- resolved Melanosis   Cornea trace PEE 1+ Punctate epithelial erosions   Anterior Chamber Deep and clear Deep and clear    Iris Round and dilated Round and dilated   Lens 2+ Nuclear sclerosis, 2-3+ Cortical cataract 2+ Nuclear sclerosis, 2-3+ Cortical cataract   Anterior Vitreous Vitreous syneresis, + RBC, blood stained vitreous condensations -- turning white, improving and settling inferiorly Vitreous syneresis         Fundus Exam       Right Left   Disc Pink and Sharp Pink and sharp, temp PPP   C/D Ratio 0.4 0.2   Macula Good foveal reflex, No heme or edema Flat, Good foveal reflex, RPE mottling, no heme or edema   Vessels attenuated, Tortuous Vascular attenuation, Tortuous   Periphery HST at 0230 w/ good cryo changes surrounding -- +shallow cuff of SRF / focal RD -- improved, good early laser changes surrounding tear attached; no heme           IMAGING AND PROCEDURES  Imaging and Procedures for 06/20/2023  OCT, Retina - OU - Both Eyes       Right Eye Quality was good. Central Foveal Thickness: 239. Progression has improved. Findings include normal foveal contour, no IRF, no SRF (Interval improvement in vitreous opacities).   Left Eye Quality was good. Central Foveal Thickness: 224. Progression has been stable. Findings include normal foveal contour, no IRF, no SRF.   Notes *Images captured and stored on drive  Diagnosis / Impression:  OD: interval improvement in vitreous opacities  OS: NFP; no IRF/SRF   Clinical management:  See below  Abbreviations: NFP - Normal foveal profile. CME - cystoid macular edema. PED - pigment epithelial detachment. IRF - intraretinal fluid. SRF - subretinal fluid. EZ - ellipsoid zone. ERM - epiretinal membrane. ORA - outer retinal atrophy. ORT - outer retinal tubulation. SRHM - subretinal hyper-reflective material. IRHM - intraretinal hyper-reflective material            ASSESSMENT/PLAN:   ICD-10-CM   1. Retinal tear of right eye  H33.311 OCT, Retina - OU - Both Eyes    2. Vitreous hemorrhage, right eye (HCC)  H43.11     3. Right retinal  detachment  H33.21  4. Hypertensive retinopathy of both eyes  H35.033     5. Essential hypertension  I10     6. Combined forms of age-related cataract of both eyes  H25.813     7. Amblyopia of right eye  H53.001       1-3. Retinal tear with VH OD - HST at 0230 with bridging vessels, +heme - s/p cryopexy OD (10.29.24) - good cryo changes around tear, but exam showed +cuff of SRF / focal RD OD on 11.06.24 - s/p laser retinopexy OD (11.06.24) -- good early laser changes surrounding tear and SRF improved - completed PF QID OD x7 days - VH precautions reviewed -- minimize activities, keep head elevated, avoid ASA/NSAIDs/blood thinners as able  - f/u in 2 wks -- POV  4,5. Hypertensive retinopathy OU - discussed importance of tight BP control - monitor   6. Mixed Cataract OU - The symptoms of cataract, surgical options, and treatments and risks were discussed with patient. - discussed diagnosis and progression - monitor   7. Amblyopia OD  Ophthalmic Meds Ordered this visit:  No orders of the defined types were placed in this encounter.    Return in about 2 weeks (around 07/04/2023) for f/u retinal tear OD, DFE, OCT.  There are no Patient Instructions on file for this visit.  Explained the diagnoses, plan, and follow up with the patient and they expressed understanding.  Patient expressed understanding of the importance of proper follow up care.  This document serves as a record of services personally performed by Karie Chimera, MD, PhD. It was created on their behalf by Charlette Caffey, COT an ophthalmic technician. The creation of this record is the provider's dictation and/or activities during the visit.    Electronically signed by:  Charlette Caffey, COT  06/20/23 4:57 PM  This document serves as a record of services personally performed by Karie Chimera, MD, PhD. It was created on their behalf by Glee Arvin. Manson Passey, OA an ophthalmic technician. The creation of  this record is the provider's dictation and/or activities during the visit.    Electronically signed by: Glee Arvin. Manson Passey, OA 06/20/23 4:57 PM  Karie Chimera, M.D., Ph.D. Diseases & Surgery of the Retina and Vitreous [facility] 06/20/2023  I have reviewed the above documentation for accuracy and completeness, and I agree with the above. Karie Chimera, M.D., Ph.D. 06/20/23 4:58 PM    Abbreviations: M myopia (nearsighted); A astigmatism; H hyperopia (farsighted); P presbyopia; Mrx spectacle prescription;  CTL contact lenses; OD right eye; OS left eye; OU both eyes  XT exotropia; ET esotropia; PEK punctate epithelial keratitis; PEE punctate epithelial erosions; DES dry eye syndrome; MGD meibomian gland dysfunction; ATs artificial tears; PFAT's preservative free artificial tears; NSC nuclear sclerotic cataract; PSC posterior subcapsular cataract; ERM epi-retinal membrane; PVD posterior vitreous detachment; RD retinal detachment; DM diabetes mellitus; DR diabetic retinopathy; NPDR non-proliferative diabetic retinopathy; PDR proliferative diabetic retinopathy; CSME clinically significant macular edema; DME diabetic macular edema; dbh dot blot hemorrhages; CWS cotton wool spot; POAG primary open angle glaucoma; C/D cup-to-disc ratio; HVF humphrey visual field; GVF goldmann visual field; OCT optical coherence tomography; IOP intraocular pressure; BRVO Branch retinal vein occlusion; CRVO central retinal vein occlusion; CRAO central retinal artery occlusion; BRAO branch retinal artery occlusion; RT retinal tear; SB scleral buckle; PPV pars plana vitrectomy; VH Vitreous hemorrhage; PRP panretinal laser photocoagulation; IVK intravitreal kenalog; VMT vitreomacular traction; MH Macular hole;  NVD neovascularization of the disc;  NVE neovascularization elsewhere; AREDS age related eye disease study; ARMD age related macular degeneration; POAG primary open angle glaucoma; EBMD  epithelial/anterior basement membrane dystrophy; ACIOL anterior chamber intraocular lens; IOL intraocular lens; PCIOL posterior chamber intraocular lens; Phaco/IOL phacoemulsification with intraocular lens placement; PRK photorefractive keratectomy; LASIK laser assisted in situ keratomileusis; HTN hypertension; DM diabetes mellitus; COPD chronic obstructive pulmonary disease

## 2023-06-20 ENCOUNTER — Encounter (INDEPENDENT_AMBULATORY_CARE_PROVIDER_SITE_OTHER): Payer: Self-pay | Admitting: Ophthalmology

## 2023-06-20 ENCOUNTER — Ambulatory Visit (INDEPENDENT_AMBULATORY_CARE_PROVIDER_SITE_OTHER): Payer: Federal, State, Local not specified - PPO | Admitting: Ophthalmology

## 2023-06-20 DIAGNOSIS — H4311 Vitreous hemorrhage, right eye: Secondary | ICD-10-CM

## 2023-06-20 DIAGNOSIS — H35033 Hypertensive retinopathy, bilateral: Secondary | ICD-10-CM

## 2023-06-20 DIAGNOSIS — H25813 Combined forms of age-related cataract, bilateral: Secondary | ICD-10-CM

## 2023-06-20 DIAGNOSIS — H3321 Serous retinal detachment, right eye: Secondary | ICD-10-CM | POA: Diagnosis not present

## 2023-06-20 DIAGNOSIS — H53001 Unspecified amblyopia, right eye: Secondary | ICD-10-CM

## 2023-06-20 DIAGNOSIS — I10 Essential (primary) hypertension: Secondary | ICD-10-CM | POA: Diagnosis not present

## 2023-06-20 DIAGNOSIS — H33311 Horseshoe tear of retina without detachment, right eye: Secondary | ICD-10-CM

## 2023-06-29 NOTE — Progress Notes (Signed)
[facility] - Clinic Note  07/05/2023   CHIEF COMPLAINT Patient presents for Retina Follow Up  HISTORY OF PRESENT ILLNESS: Marie Barker is a 62 y.o. female who presents to the clinic today for:  HPI     Retina Follow Up   In right eye.  This started 2 weeks ago.  Duration of 2 weeks.  Since onset it is stable.  I, the attending physician,  performed the HPI with the patient and updated documentation appropriately.        Comments   2 week retina follow up tear OD pt is reporting vision seems little better she denies any flashes some floaters       Last edited by Marie Chris, MD on 07/05/2023 12:28 PM.    Patient states that the floaters are decreasing. She has been sleeping with her head elevated, but it sometimes aggravates sciatica  Referring physician: Melida Quitter, MD [address] [place],  [place] [address]  HISTORICAL INFORMATION:  Selected notes from the MEDICAL RECORD NUMBER Referred by Dr. Harriette Barker for vitreous hemorrhage OD LEE:  Ocular Hx- PMH-   CURRENT MEDICATIONS: Current Outpatient Medications (Ophthalmic Drugs)  Medication Sig   neomycin-polymyxin-dexameth (MAXITROL) 0.1 % OINT Place 1 Application into the right eye 4 (four) times daily. (Patient not taking: Reported on 06/14/2023)   No current facility-administered medications for this visit. (Ophthalmic Drugs)   Current Outpatient Medications (Other)  Medication Sig   atenolol (TENORMIN) 50 MG tablet Take 50 mg by mouth daily.   hydrochlorothiazide (HYDRODIURIL) 25 MG tablet Take 25 mg by mouth every morning.   No current facility-administered medications for this visit. (Other)   REVIEW OF SYSTEMS: ROS   Positive for: Cardiovascular, Eyes Last edited by Marie Barker, COT on 07/05/2023  9:43 AM.      ALLERGIES Allergies  Allergen Reactions   Aspirin Nausea Only   PAST MEDICAL HISTORY Past Medical History:  Diagnosis Date   Allergy    seasonal    Arthritis    Chronic kidney disease    pt denies 6/17/14sd   Hypertension    IBS (irritable bowel syndrome)    Tubular adenoma 2009   Past Surgical History:  Procedure Laterality Date   CESAREAN SECTION     CHOLECYSTECTOMY  1999   COLONOSCOPY     KNEE ARTHROSCOPY Right    POLYPECTOMY     FAMILY HISTORY Family History  Problem Relation Age of Onset   Colon cancer Cousin        (father's sister's daughter()   Breast cancer Cousin    Heart disease Brother    Asthma Sister    Hypertension Mother    Hypertension Sister    SOCIAL HISTORY Social History   Tobacco Use   Smoking status: Never   Smokeless tobacco: Never  Vaping Use   Vaping status: Never Used  Substance Use Topics   Alcohol use: Yes    Comment: ocassional   Drug use: No       OPHTHALMIC EXAM:  Base Eye Exam     Visual Acuity (Snellen - Linear)       Right Left   Dist [name] 20/80 -2    Dist cc  20/25 -1   Dist ph [name] 20/70 -2    Dist ph cc  NI    Correction: Contacts  Cl in OS         Tonometry (Tonopen, 9:48 AM)       Right  Left   Pressure 13 16         Pupils       Pupils Dark Light Shape React APD   Right PERRL 3 2 Round Brisk None   Left PERRL 3 2 Round Brisk None         Visual Fields       Left Right    Full Full         Extraocular Movement       Right Left    Full, Ortho Full, Ortho         Neuro/Psych     Oriented x3: Yes   Mood/Affect: Normal         Dilation     Right eye: 2.5% Phenylephrine @ 9:48 AM           Slit Lamp and Fundus Exam     External Exam       Right Left   External Normal Normal         Slit Lamp Exam       Right Left   Lids/Lashes Dermatochalasis - upper lid Dermatochalasis - upper lid   Conjunctiva/Sclera Melanosis, SCH -- resolved Melanosis   Cornea 1+ PEE, Debris in tear film 1+ Punctate epithelial erosions   Anterior Chamber Deep and clear Deep and clear   Iris Round and dilated Round and reactive   Lens 2+  Nuclear sclerosis, 2-3+ Cortical cataract 2+ Nuclear sclerosis, 2-3+ Cortical cataract   Anterior Vitreous Vitreous syneresis, + RBC/pigment, blood stained vitreous condensations -- turning white, improving and settling inferiorly Vitreous syneresis         Fundus Exam       Right Left   Disc Pink and Sharp, PPP Pink and sharp, temp PPP   C/D Ratio 0.4 0.2   Macula Good foveal reflex, No heme or edema Flat, Good foveal reflex, RPE mottling, no heme or edema   Vessels attenuated, Tortuous Vascular attenuation, Tortuous   Periphery HST at 0230 w/ good cryo changes surrounding -- +shallow cuff of SRF / focal RD -- improved, good early laser changes surrounding tear, focal lattice 0600 with good laser changes, no new RT/RD/lattice attached; no heme           IMAGING AND PROCEDURES  Imaging and Procedures for 07/05/2023  OCT, Retina - OU - Both Eyes       Right Eye Quality was good. Central Foveal Thickness: 235. Progression has improved. Findings include normal foveal contour, no IRF, no SRF (Interval improvement in vitreous opacities).   Left Eye Quality was good. Central Foveal Thickness: 226. Progression has been stable. Findings include normal foveal contour, no IRF, no SRF.   Notes *Images captured and stored on drive  Diagnosis / Impression:  NFP; no IRF/SRF OU OD: interval improvement in vitreous opacities    Clinical management:  See below  Abbreviations: NFP - Normal foveal profile. CME - cystoid macular edema. PED - pigment epithelial detachment. IRF - intraretinal fluid. SRF - subretinal fluid. EZ - ellipsoid zone. ERM - epiretinal membrane. ORA - outer retinal atrophy. ORT - outer retinal tubulation. SRHM - subretinal hyper-reflective material. IRHM - intraretinal hyper-reflective material           ASSESSMENT/PLAN:   ICD-10-CM   1. Retinal tear of right eye  H33.311 OCT, Retina - OU - Both Eyes    2. Vitreous hemorrhage, right eye (HCC)  H43.11     3.  Right retinal detachment  H33.21  4. Hypertensive retinopathy of both eyes  H35.033     5. Essential hypertension  I10     6. Combined forms of age-related cataract of both eyes  H25.813      1-3. Retinal tear with VH OD - HST at 0230 with bridging vessels, +heme - s/p cryopexy OD (10.29.24) - good cryo changes around tear, but exam showed +cuff of SRF / focal RD OD on 11.06.24 - s/p laser retinopexy OD (11.06.24) -- good laser changes surrounding tear and SRF improved - completed PF QID OD x7 days - VH improving on exam and OCT - BCVA OD 20/70 from 20/80 - VH precautions reviewed -- minimize activities, keep head elevated, avoid ASA/NSAIDs/blood thinners as able  - f/u in 8 wks -- DFE/OCT  4,5. Hypertensive retinopathy OU - discussed importance of tight BP control - monitor   6. Mixed Cataract OU - The symptoms of cataract, surgical options, and treatments and risks were discussed with patient. - discussed diagnosis and progression - monitor   7. Amblyopia OD  Ophthalmic Meds Ordered this visit:  No orders of the defined types were placed in this encounter.    Return in about 8 weeks (around 08/30/2023) for f/u Retinal tear OD, DFE, OCT.  There are no Patient Instructions on file for this visit.  Explained the diagnoses, plan, and follow up with the patient and they expressed understanding.  Patient expressed understanding of the importance of proper follow up care.  This document serves as a record of services personally performed by Karie Chimera, MD, PhD. It was created on their behalf by De Blanch, an ophthalmic technician. The creation of this record is the provider's dictation and/or activities during the visit.    Electronically signed by: De Blanch, OA, 07/05/23  12:28 PM  This document serves as a record of services personally performed by Karie Chimera, MD, PhD. It was created on their behalf by Charlette Caffey, COT an ophthalmic  technician. The creation of this record is the provider's dictation and/or activities during the visit.    Electronically signed by:  Charlette Caffey, COT  07/05/23 12:28 PM  Karie Chimera, M.D., Ph.D. Diseases & Surgery of the Retina and Vitreous [facility] 07/05/2023  I have reviewed the above documentation for accuracy and completeness, and I agree with the above. Karie Chimera, M.D., Ph.D. 07/05/23 12:30 PM  Abbreviations: M myopia (nearsighted); A astigmatism; H hyperopia (farsighted); P presbyopia; Mrx spectacle prescription;  CTL contact lenses; OD right eye; OS left eye; OU both eyes  XT exotropia; ET esotropia; PEK punctate epithelial keratitis; PEE punctate epithelial erosions; DES dry eye syndrome; MGD meibomian gland dysfunction; ATs artificial tears; PFAT's preservative free artificial tears; NSC nuclear sclerotic cataract; PSC posterior subcapsular cataract; ERM epi-retinal membrane; PVD posterior vitreous detachment; RD retinal detachment; DM diabetes mellitus; DR diabetic retinopathy; NPDR non-proliferative diabetic retinopathy; PDR proliferative diabetic retinopathy; CSME clinically significant macular edema; DME diabetic macular edema; dbh dot blot hemorrhages; CWS cotton wool spot; POAG primary open angle glaucoma; C/D cup-to-disc ratio; HVF humphrey visual field; GVF goldmann visual field; OCT optical coherence tomography; IOP intraocular pressure; BRVO Branch retinal vein occlusion; CRVO central retinal vein occlusion; CRAO central retinal artery occlusion; BRAO branch retinal artery occlusion; RT retinal tear; SB scleral buckle; PPV pars plana vitrectomy; VH Vitreous hemorrhage; PRP panretinal laser photocoagulation; IVK intravitreal kenalog; VMT vitreomacular traction; MH Macular hole;  NVD neovascularization of the disc; NVE neovascularization elsewhere; AREDS  age related eye disease study; ARMD age related macular degeneration; POAG primary open  angle glaucoma; EBMD epithelial/anterior basement membrane dystrophy; ACIOL anterior chamber intraocular lens; IOL intraocular lens; PCIOL posterior chamber intraocular lens; Phaco/IOL phacoemulsification with intraocular lens placement; PRK photorefractive keratectomy; LASIK laser assisted in situ keratomileusis; HTN hypertension; DM diabetes mellitus; COPD chronic obstructive pulmonary disease

## 2023-07-05 ENCOUNTER — Ambulatory Visit (INDEPENDENT_AMBULATORY_CARE_PROVIDER_SITE_OTHER): Payer: Federal, State, Local not specified - PPO | Admitting: Ophthalmology

## 2023-07-05 ENCOUNTER — Encounter (INDEPENDENT_AMBULATORY_CARE_PROVIDER_SITE_OTHER): Payer: Self-pay | Admitting: Ophthalmology

## 2023-07-05 DIAGNOSIS — H4311 Vitreous hemorrhage, right eye: Secondary | ICD-10-CM | POA: Diagnosis not present

## 2023-07-05 DIAGNOSIS — H35033 Hypertensive retinopathy, bilateral: Secondary | ICD-10-CM | POA: Diagnosis not present

## 2023-07-05 DIAGNOSIS — I10 Essential (primary) hypertension: Secondary | ICD-10-CM

## 2023-07-05 DIAGNOSIS — H3321 Serous retinal detachment, right eye: Secondary | ICD-10-CM | POA: Diagnosis not present

## 2023-07-05 DIAGNOSIS — H25813 Combined forms of age-related cataract, bilateral: Secondary | ICD-10-CM

## 2023-07-05 DIAGNOSIS — H33311 Horseshoe tear of retina without detachment, right eye: Secondary | ICD-10-CM | POA: Diagnosis not present

## 2023-07-14 DIAGNOSIS — I10 Essential (primary) hypertension: Secondary | ICD-10-CM | POA: Diagnosis not present

## 2023-07-21 DIAGNOSIS — E785 Hyperlipidemia, unspecified: Secondary | ICD-10-CM | POA: Diagnosis not present

## 2023-07-21 DIAGNOSIS — Z23 Encounter for immunization: Secondary | ICD-10-CM | POA: Diagnosis not present

## 2023-07-21 DIAGNOSIS — Z0000 Encounter for general adult medical examination without abnormal findings: Secondary | ICD-10-CM | POA: Diagnosis not present

## 2023-07-21 DIAGNOSIS — Z1331 Encounter for screening for depression: Secondary | ICD-10-CM | POA: Diagnosis not present

## 2023-07-21 DIAGNOSIS — I10 Essential (primary) hypertension: Secondary | ICD-10-CM | POA: Diagnosis not present

## 2023-07-21 DIAGNOSIS — Z1339 Encounter for screening examination for other mental health and behavioral disorders: Secondary | ICD-10-CM | POA: Diagnosis not present

## 2023-08-14 ENCOUNTER — Other Ambulatory Visit: Payer: Self-pay | Admitting: Internal Medicine

## 2023-08-14 DIAGNOSIS — E785 Hyperlipidemia, unspecified: Secondary | ICD-10-CM

## 2023-08-28 ENCOUNTER — Encounter (INDEPENDENT_AMBULATORY_CARE_PROVIDER_SITE_OTHER): Payer: Federal, State, Local not specified - PPO | Admitting: Ophthalmology

## 2023-08-28 DIAGNOSIS — H53001 Unspecified amblyopia, right eye: Secondary | ICD-10-CM

## 2023-08-28 DIAGNOSIS — H25813 Combined forms of age-related cataract, bilateral: Secondary | ICD-10-CM

## 2023-08-28 DIAGNOSIS — H33311 Horseshoe tear of retina without detachment, right eye: Secondary | ICD-10-CM

## 2023-08-28 DIAGNOSIS — H35033 Hypertensive retinopathy, bilateral: Secondary | ICD-10-CM

## 2023-08-28 DIAGNOSIS — H4311 Vitreous hemorrhage, right eye: Secondary | ICD-10-CM

## 2023-08-28 DIAGNOSIS — H3321 Serous retinal detachment, right eye: Secondary | ICD-10-CM

## 2023-08-28 DIAGNOSIS — I10 Essential (primary) hypertension: Secondary | ICD-10-CM

## 2023-08-30 ENCOUNTER — Encounter (INDEPENDENT_AMBULATORY_CARE_PROVIDER_SITE_OTHER): Payer: Federal, State, Local not specified - PPO | Admitting: Ophthalmology

## 2023-08-31 ENCOUNTER — Ambulatory Visit: Payer: Federal, State, Local not specified - PPO | Admitting: *Deleted

## 2023-08-31 ENCOUNTER — Other Ambulatory Visit: Payer: BC Managed Care – PPO

## 2023-08-31 VITALS — Ht 62.0 in | Wt 240.0 lb

## 2023-08-31 DIAGNOSIS — Z860100 Personal history of colon polyps, unspecified: Secondary | ICD-10-CM

## 2023-08-31 MED ORDER — SUFLAVE 178.7 G PO SOLR: 1.0000 | ORAL | 0 refills | Status: DC

## 2023-08-31 NOTE — Progress Notes (Signed)
Pt's name and DOB verified at the beginning of the pre-visit wit 2 identifiers  Pt denies any difficulty with ambulating,sitting, laying down or rolling side to side  Pt has no issues with ambulation   Pt has no issues moving head neck or swallowing  No egg or soy allergy known to patient   No issues known to pt with past sedation with any surgeries or procedures  Pt denies having issues being intubated  No FH of Malignant Hyperthermia  Pt is not on diet pills or shots  Pt is not on home 02   Pt is not on blood thinners   Pt denies issues with constipation   Pt is not on dialysis  Pt denise any abnormal heart rhythms   Pt denies any upcoming cardiac testing  Pt encouraged to use to use Singlecare or Goodrx to reduce cost   Patient's chart reviewed by Cathlyn Parsons CNRA prior to pre-visit and patient appropriate for the [facility].  Pre-visit completed and red dot placed by patient's name on their procedure day (on provider's schedule).  .  Visit by phone  Pt states weight is 240 lb  Instructed pt why it is important to and  to call if they have any changes in health or new medications. Directed them to the # given and on instructions.     Instructions reviewed. Pt given both [facility] main # and MD on call # prior to instructions.  Pt states understanding. Instructed to review again prior to procedure. Pt states they will.   Informed pt that they will receive a call from [facility] regarding there prep med.

## 2023-09-07 NOTE — Progress Notes (Signed)
[facility] - Clinic Note  09/08/2023   CHIEF COMPLAINT Patient presents for Retina Follow Up  HISTORY OF PRESENT ILLNESS: Marie Barker is a 63 y.o. female who presents to the clinic today for:  HPI     Retina Follow Up   In both eyes.  This started 8 weeks ago.  Duration of 8 weeks.  Since onset it is stable.  I, the attending physician,  performed the HPI with the patient and updated documentation appropriately.        Comments   8 week retina follow up rear OD pt is reporting no vision changes noticed she has some floater denies flashes       Last edited by Rennis Chris, MD on 09/09/2023  5:13 PM.    Patient states her vision is better, but she is still seeing floaters OD, she is not using any drops  Referring physician: Melida Quitter, MD [address] [place],  [place] [address]  HISTORICAL INFORMATION:  Selected notes from the MEDICAL RECORD NUMBER Referred by Dr. Harriette Bouillon for vitreous hemorrhage OD LEE:  Ocular Hx- PMH-   CURRENT MEDICATIONS: Current Outpatient Medications (Ophthalmic Drugs)  Medication Sig   neomycin-polymyxin-dexameth (MAXITROL) 0.1 % OINT Place 1 Application into the right eye 4 (four) times daily. (Patient not taking: Reported on 08/31/2023)   No current facility-administered medications for this visit. (Ophthalmic Drugs)   Current Outpatient Medications (Other)  Medication Sig   atenolol (TENORMIN) 50 MG tablet Take 50 mg by mouth daily.   atorvastatin (LIPITOR) 20 MG tablet Take 20 mg by mouth at bedtime.   Cholecalciferol (VITAMIN D-3) 125 MCG (5000 UT) TABS Take by mouth.   EPINEPHrine 0.3 mg/0.3 mL IJ SOAJ injection USE AS DIRECTED AS NEEDED   hydrochlorothiazide (HYDRODIURIL) 25 MG tablet Take 25 mg by mouth every morning.   loratadine (CLARITIN) 10 MG tablet Take by mouth.   naproxen (NAPROSYN) 500 MG tablet SMARTSIG:1 Tablet(s) By Mouth Every 12 Hours PRN   PEG 3350-KCl-NaCl-NaSulf-MgSul (SUFLAVE) 178.7 g SOLR  Take 1 kit by mouth as directed.   No current facility-administered medications for this visit. (Other)   REVIEW OF SYSTEMS: ROS   Positive for: Cardiovascular, Eyes Last edited by Etheleen Mayhew, COT on 09/08/2023  1:40 PM.     ALLERGIES Allergies  Allergen Reactions   Aspirin Nausea Only   PAST MEDICAL HISTORY Past Medical History:  Diagnosis Date   Allergy    seasonal   Arthritis    Hyperlipidemia    Hypertension    IBS (irritable bowel syndrome)    Tubular adenoma 08/09/2007   Past Surgical History:  Procedure Laterality Date   CESAREAN SECTION     CHOLECYSTECTOMY  1999   COLONOSCOPY     KNEE ARTHROSCOPY Right    POLYPECTOMY     FAMILY HISTORY Family History  Problem Relation Age of Onset   Hypertension Mother    Colon polyps Sister    Asthma Sister    Hypertension Sister    Heart disease Brother    Colon cancer Cousin        (father's sister's daughter()   Breast cancer Cousin    Esophageal cancer Neg Hx    SOCIAL HISTORY Social History   Tobacco Use   Smoking status: Never   Smokeless tobacco: Never  Vaping Use   Vaping status: Never Used  Substance Use Topics   Alcohol use: Yes    Comment: ocassional   Drug use: No  OPHTHALMIC EXAM:  Base Eye Exam     Visual Acuity (Snellen - Linear)       Right Left   Dist [place] 20/70 20/25   Dist cc 20/60    Dist ph [place]  NI    Correction: Contacts         Tonometry (Tonopen, 1:48 PM)       Right Left   Pressure 17 15         Pupils       Pupils Dark Light Shape React APD   Right PERRL 3 2 Round Brisk None   Left PERRL 3 2 Round Brisk None         Visual Fields       Left Right    Full Full         Extraocular Movement       Right Left    Full, Ortho Full, Ortho         Neuro/Psych     Oriented x3: Yes   Mood/Affect: Normal         Dilation     Both eyes: 2.5% Phenylephrine @ 1:48 PM           Slit Lamp and Fundus Exam     External Exam        Right Left   External Normal Normal         Slit Lamp Exam       Right Left   Lids/Lashes Dermatochalasis - upper lid Dermatochalasis - upper lid   Conjunctiva/Sclera Melanosis, SCH -- resolved Melanosis   Cornea 1+ PEE, tear film debris 1+ Punctate epithelial erosions   Anterior Chamber Deep and clear Deep and clear   Iris Round and dilated Round and reactive   Lens 2+ Nuclear sclerosis, 2-3+ Cortical cataract 2+ Nuclear sclerosis, 2-3+ Cortical cataract   Anterior Vitreous Vitreous syneresis, + RBC/pigment, blood stained vitreous condensations -- turning white, improving and settling inferiorly, old white VH settled inferiorly Vitreous syneresis         Fundus Exam       Right Left   Disc Pink and Sharp, PPP Pink and sharp, temp PPP   C/D Ratio 0.4 0.3   Macula Good foveal reflex, No heme or edema Flat, Good foveal reflex, RPE mottling, no heme or edema   Vessels attenuated, Tortuous attenuated, mild tortuosity   Periphery HST at 0230 w/ good cryo changes surrounding -- +shallow cuff of SRF / focal RD -- improved, good early laser changes surrounding tear, focal lattice 0600 with good laser changes, no new RT/RD/lattice, inferior paving stone degeneration attached; no heme           IMAGING AND PROCEDURES  Imaging and Procedures for 09/08/2023  OCT, Retina - OU - Both Eyes       Right Eye Quality was good. Central Foveal Thickness: 234. Progression has improved. Findings include normal foveal contour, no IRF, no SRF (Interval improvement in vitreous opacities).   Left Eye Quality was good. Central Foveal Thickness: 227. Progression has been stable. Findings include normal foveal contour, no IRF, no SRF.   Notes *Images captured and stored on drive  Diagnosis / Impression:  NFP; no IRF/SRF OU OD: interval improvement in vitreous opacities    Clinical management:  See below  Abbreviations: NFP - Normal foveal profile. CME - cystoid macular edema. PED -  pigment epithelial detachment. IRF - intraretinal fluid. SRF - subretinal fluid. EZ - ellipsoid zone. ERM - epiretinal  membrane. ORA - outer retinal atrophy. ORT - outer retinal tubulation. SRHM - subretinal hyper-reflective material. IRHM - intraretinal hyper-reflective material           ASSESSMENT/PLAN:   ICD-10-CM   1. Retinal tear of right eye  H33.311 OCT, Retina - OU - Both Eyes    2. Vitreous hemorrhage, right eye (HCC)  H43.11     3. Right retinal detachment  H33.21     4. Hypertensive retinopathy of both eyes  H35.033     5. Essential hypertension  I10     6. Combined forms of age-related cataract of both eyes  H25.813     7. Amblyopia of right eye  H53.001      1-3. Retinal tear with VH OD - HST at 0230 with bridging vessels, +heme - s/p cryopexy OD (10.29.24) - good cryo changes around tear, but exam showed +cuff of SRF / focal RD OD on 11.06.24 - s/p laser retinopexy OD (11.06.24) -- good laser changes surrounding tear and SRF improved - VH improving on exam and OCT - BCVA OD 20/60 from 20/70 - VH precautions reviewed -- minimize activities, keep head elevated, avoid ASA/NSAIDs/blood thinners as able  - f/u in  -- DFE/OCT  4,5. Hypertensive retinopathy OU - discussed importance of tight BP control - monitor   6. Mixed Cataract OU - The symptoms of cataract, surgical options, and treatments and risks were discussed with patient. - discussed diagnosis and progression - monitor   7. Amblyopia OD  Ophthalmic Meds Ordered this visit:  No orders of the defined types were placed in this encounter.    Return in about 3 months (around 12/06/2023) for f/u retinal tear OD, DFE, OCT.  There are no Patient Instructions on file for this visit.  This document serves as a record of services personally performed by Karie Chimera, MD, PhD. It was created on their behalf by Berlin Hun COT, an ophthalmic technician. The creation of this record is the provider's  dictation and/or activities during the visit.    Electronically signed by: Berlin Hun COT 01.30.255:14 PM  This document serves as a record of services personally performed by Karie Chimera, MD, PhD. It was created on their behalf by Glee Arvin. Manson Passey, OA an ophthalmic technician. The creation of this record is the provider's dictation and/or activities during the visit.    Electronically signed by: Glee Arvin. Manson Passey, OA 09/09/23 5:14 PM   Karie Chimera, M.D., Ph.D. Diseases & Surgery of the Retina and Vitreous [facility] 09/08/2023  I have reviewed the above documentation for accuracy and completeness, and I agree with the above. Karie Chimera, M.D., Ph.D. 09/09/23 5:15 PM   Abbreviations: M myopia (nearsighted); A astigmatism; H hyperopia (farsighted); P presbyopia; Mrx spectacle prescription;  CTL contact lenses; OD right eye; OS left eye; OU both eyes  XT exotropia; ET esotropia; PEK punctate epithelial keratitis; PEE punctate epithelial erosions; DES dry eye syndrome; MGD meibomian gland dysfunction; ATs artificial tears; PFAT's preservative free artificial tears; NSC nuclear sclerotic cataract; PSC posterior subcapsular cataract; ERM epi-retinal membrane; PVD posterior vitreous detachment; RD retinal detachment; DM diabetes mellitus; DR diabetic retinopathy; NPDR non-proliferative diabetic retinopathy; PDR proliferative diabetic retinopathy; CSME clinically significant macular edema; DME diabetic macular edema; dbh dot blot hemorrhages; CWS cotton wool spot; POAG primary open angle glaucoma; C/D cup-to-disc ratio; HVF humphrey visual field; GVF goldmann visual field; OCT optical coherence tomography; IOP intraocular pressure; BRVO Branch retinal vein  occlusion; CRVO central retinal vein occlusion; CRAO central retinal artery occlusion; BRAO branch retinal artery occlusion; RT retinal tear; SB scleral buckle; PPV pars plana vitrectomy; VH Vitreous hemorrhage;  PRP panretinal laser photocoagulation; IVK intravitreal kenalog; VMT vitreomacular traction; MH Macular hole;  NVD neovascularization of the disc; NVE neovascularization elsewhere; AREDS age related eye disease study; ARMD age related macular degeneration; POAG primary open angle glaucoma; EBMD epithelial/anterior basement membrane dystrophy; ACIOL anterior chamber intraocular lens; IOL intraocular lens; PCIOL posterior chamber intraocular lens; Phaco/IOL phacoemulsification with intraocular lens placement; PRK photorefractive keratectomy; LASIK laser assisted in situ keratomileusis; HTN hypertension; DM diabetes mellitus; COPD chronic obstructive pulmonary disease

## 2023-09-08 ENCOUNTER — Ambulatory Visit (INDEPENDENT_AMBULATORY_CARE_PROVIDER_SITE_OTHER): Payer: Federal, State, Local not specified - PPO | Admitting: Ophthalmology

## 2023-09-08 ENCOUNTER — Encounter (INDEPENDENT_AMBULATORY_CARE_PROVIDER_SITE_OTHER): Payer: Self-pay | Admitting: Ophthalmology

## 2023-09-08 DIAGNOSIS — H33311 Horseshoe tear of retina without detachment, right eye: Secondary | ICD-10-CM

## 2023-09-08 DIAGNOSIS — H4311 Vitreous hemorrhage, right eye: Secondary | ICD-10-CM | POA: Diagnosis not present

## 2023-09-08 DIAGNOSIS — H25813 Combined forms of age-related cataract, bilateral: Secondary | ICD-10-CM

## 2023-09-08 DIAGNOSIS — H3321 Serous retinal detachment, right eye: Secondary | ICD-10-CM | POA: Diagnosis not present

## 2023-09-08 DIAGNOSIS — H35033 Hypertensive retinopathy, bilateral: Secondary | ICD-10-CM

## 2023-09-08 DIAGNOSIS — I10 Essential (primary) hypertension: Secondary | ICD-10-CM | POA: Diagnosis not present

## 2023-09-08 DIAGNOSIS — H53001 Unspecified amblyopia, right eye: Secondary | ICD-10-CM

## 2023-09-09 ENCOUNTER — Encounter (INDEPENDENT_AMBULATORY_CARE_PROVIDER_SITE_OTHER): Payer: Self-pay | Admitting: Ophthalmology

## 2023-09-21 NOTE — Progress Notes (Unsigned)
[facility] History and Physical   Primary Care Physician:  Marie Quitter, MD   Reason for Procedure:  Personal history of adenomatous colon polyp, family history of colorectal cancer in a second-degree relative-cousin  Plan:    Surveillance colonoscopy  HPI: Marie Barker is a 63 y.o. female undergoing surveillance colonoscopy for a personal history of adenomatous colon polyps as well as a family history of colorectal cancer and a secondary relative-cousin.  Marie Barker has had 2 colonoscopies in 2009 and 2014.  Colonoscopy in 2009 disclosed a nonadvanced tubular adenoma.  Normal colonoscopy in 2014.   Past Medical History:  Diagnosis Date   Allergy    seasonal   Arthritis    Hyperlipidemia    Hypertension    IBS (irritable bowel syndrome)    Tubular adenoma 08/09/2007    Past Surgical History:  Procedure Laterality Date   CESAREAN SECTION     CHOLECYSTECTOMY  1999   COLONOSCOPY     KNEE ARTHROSCOPY Right    POLYPECTOMY      Prior to Admission medications   Medication Sig Start Date End Date Taking? Authorizing Provider  atenolol (TENORMIN) 50 MG tablet Take 50 mg by mouth daily.    [provider]  atorvastatin (LIPITOR) 20 MG tablet Take 20 mg by mouth at bedtime.    [provider]  Cholecalciferol (VITAMIN D-3) 125 MCG (5000 UT) TABS Take by mouth.    [provider]  EPINEPHrine 0.3 mg/0.3 mL IJ SOAJ injection USE AS DIRECTED AS NEEDED    [provider]  hydrochlorothiazide (HYDRODIURIL) 25 MG tablet Take 25 mg by mouth every morning. 04/01/21   [provider]  loratadine (CLARITIN) 10 MG tablet Take by mouth. 04/23/21   [provider]  naproxen (NAPROSYN) 500 MG tablet SMARTSIG:1 Tablet(s) By Mouth Every 12 Hours PRN 08/06/23   [provider]  neomycin-polymyxin-dexameth (MAXITROL) 0.1 % OINT Place 1 Application into the right eye 4 (four) times daily. Patient not taking: Reported on  08/31/2023 06/06/23   Rennis Chris, MD  PEG 3350-KCl-NaCl-NaSulf-MgSul (SUFLAVE) 178.7 g SOLR Take 1 kit by mouth as directed. 08/31/23   Ottie Glazier, MD    Current Outpatient Medications  Medication Sig Dispense Refill   atenolol (TENORMIN) 50 MG tablet Take 50 mg by mouth daily.     atorvastatin (LIPITOR) 20 MG tablet Take 20 mg by mouth at bedtime.     Cholecalciferol (VITAMIN D-3) 125 MCG (5000 UT) TABS Take by mouth.     EPINEPHrine 0.3 mg/0.3 mL IJ SOAJ injection USE AS DIRECTED AS NEEDED     hydrochlorothiazide (HYDRODIURIL) 25 MG tablet Take 25 mg by mouth every morning.     loratadine (CLARITIN) 10 MG tablet Take by mouth.     naproxen (NAPROSYN) 500 MG tablet SMARTSIG:1 Tablet(s) By Mouth Every 12 Hours PRN     neomycin-polymyxin-dexameth (MAXITROL) 0.1 % OINT Place 1 Application into the right eye 4 (four) times daily. (Patient not taking: Reported on 08/31/2023) 3.5 g 1   PEG 3350-KCl-NaCl-NaSulf-MgSul (SUFLAVE) 178.7 g SOLR Take 1 kit by mouth as directed. 1 each 0   No current facility-administered medications for this visit.    Allergies as of 09/22/2023 - Review Complete 09/18/2023  Allergen Reaction Noted   Aspirin Nausea Only 01/24/2008    Family History  Problem Relation Age of Onset   Hypertension Mother    Colon polyps Sister    Asthma Sister    Hypertension Sister  Heart disease Brother    Colon cancer Cousin        (father's sister's daughter()   Breast cancer Cousin    Esophageal cancer Neg Hx     Social History   Socioeconomic History   Marital status: Single    Spouse name: Not on file   Number of children: 1   Years of education: Not on file   Highest education level: Not on file  Occupational History   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    Employer: [employer]   Occupation: unemployed  Tobacco Use   Smoking status: Never   Smokeless tobacco: Never  Vaping Use   Vaping status: Never Used  Substance and Sexual Activity    Alcohol use: Yes    Comment: ocassional   Drug use: No   Sexual activity: Not Currently    Birth control/protection: Post-menopausal  Other Topics Concern   Not on file  Social History Narrative   Not on file   Social Drivers of Health   Financial Resource Strain: Not on file  Food Insecurity: Not on file  Transportation Needs: Not on file  Physical Activity: Not on file  Stress: Not on file  Social Connections: Not on file  Intimate Partner Violence: Not on file    Review of Systems:  All other review of systems negative except as mentioned in the HPI.  Physical Exam: Vital signs LMP 03/04/2013   General:   Alert,  Well-developed, well-nourished, pleasant and cooperative in NAD Airway:  Mallampati  Lungs:  Clear throughout to auscultation.   Heart:  Regular rate and rhythm; no murmurs, clicks, rubs,  or gallops. Abdomen:  Soft, nontender and nondistended. Normal bowel sounds.   Neuro/Psych:  Normal mood and affect. A and O x 3  Marie Beach, MD [facility]

## 2023-09-22 ENCOUNTER — Encounter: Payer: Self-pay | Admitting: Pediatrics

## 2023-09-22 ENCOUNTER — Ambulatory Visit (AMBULATORY_SURGERY_CENTER): Payer: Self-pay | Admitting: Pediatrics

## 2023-09-22 VITALS — BP 106/73 | HR 62 | Temp 98.1°F | Resp 13 | Ht 62.0 in | Wt 240.0 lb

## 2023-09-22 DIAGNOSIS — Z860101 Personal history of adenomatous and serrated colon polyps: Secondary | ICD-10-CM

## 2023-09-22 DIAGNOSIS — Z860100 Personal history of colon polyps, unspecified: Secondary | ICD-10-CM

## 2023-09-22 DIAGNOSIS — K648 Other hemorrhoids: Secondary | ICD-10-CM

## 2023-09-22 DIAGNOSIS — Z1211 Encounter for screening for malignant neoplasm of colon: Secondary | ICD-10-CM

## 2023-09-22 DIAGNOSIS — K644 Residual hemorrhoidal skin tags: Secondary | ICD-10-CM

## 2023-09-22 HISTORY — DX: Morbid (severe) obesity due to excess calories: E66.01

## 2023-09-22 MED ORDER — SODIUM CHLORIDE 0.9 % IV SOLN: 500.0000 mL | Freq: Once | INTRAVENOUS | Status: DC

## 2023-09-22 NOTE — Progress Notes (Signed)
Report given to PACU, vss

## 2023-09-22 NOTE — Progress Notes (Signed)
Pt's states no medical or surgical changes since previsit or office visit.

## 2023-09-22 NOTE — Patient Instructions (Signed)
Handout provided on hemorrhoids.  Resume previous diet.  Continue present medications.  Repeat colonoscopy in 10 years for surveillance.   YOU HAD AN ENDOSCOPIC PROCEDURE TODAY AT THE [facility]:   Refer to the procedure report that was given to you for any specific questions about what was found during the examination.  If the procedure report does not answer your questions, please call your gastroenterologist to clarify.  If you requested that your care partner not be given the details of your procedure findings, then the procedure report has been included in a sealed envelope for you to review at your convenience later.  YOU SHOULD EXPECT: Some feelings of bloating in the abdomen. Passage of more gas than usual.  Walking can help get rid of the air that was put into your GI tract during the procedure and reduce the bloating. If you had a lower endoscopy (such as a colonoscopy or flexible sigmoidoscopy) you may notice spotting of blood in your stool or on the toilet paper. If you underwent a bowel prep for your procedure, you may not have a normal bowel movement for a few days.  Please Note:  You might notice some irritation and congestion in your nose or some drainage.  This is from the oxygen used during your procedure.  There is no need for concern and it should clear up in a day or so.  SYMPTOMS TO REPORT IMMEDIATELY:  Following lower endoscopy (colonoscopy or flexible sigmoidoscopy):  Excessive amounts of blood in the stool  Significant tenderness or worsening of abdominal pains  Swelling of the abdomen that is new, acute  Fever of 100F or higher  For urgent or emergent issues, a gastroenterologist can be reached at any hour by calling [phone]. Do not use MyChart messaging for urgent concerns.    DIET:  We do recommend a small meal at first, but then you may proceed to your regular diet.  Drink plenty of fluids but you should avoid alcoholic beverages for 24  hours.  ACTIVITY:  You should plan to take it easy for the rest of today and you should NOT DRIVE or use heavy machinery until tomorrow (because of the sedation medicines used during the test).    FOLLOW UP: Our staff will call the number listed on your records the next business day following your procedure.  We will call around 7:15- 8:00 am to check on you and address any questions or concerns that you may have regarding the information given to you following your procedure. If we do not reach you, we will leave a message.     If any biopsies were taken you will be contacted by phone or by letter within the next 1-3 weeks.  Please call us at [phone] if you have not heard about the biopsies in 3 weeks.    SIGNATURES/CONFIDENTIALITY: You and/or your care partner have signed paperwork which will be entered into your electronic medical record.  These signatures attest to the fact that that the information above on your After Visit Summary has been reviewed and is understood.  Full responsibility of the confidentiality of this discharge information lies with you and/or your care-partner.

## 2023-09-22 NOTE — Op Note (Signed)
[facility] Patient Name: Marie Barker Procedure Date: 09/22/2023 8:54 AM MRN: [id number] Endoscopist: Maren Beach , MD, [id number] Age: 63 Referring MD:  Date of Birth: 04/07/1961 Gender: Female Account #: [id number] Procedure:                Colonoscopy Indications:              High risk colon cancer surveillance: Personal                            history of non-advanced adenoma, Last colonoscopy:                            2014; Patient had a TA on colonoscopy 2009; normal                            colonoscopy 2014 Medicines:                Monitored Anesthesia Care Procedure:                Pre-Anesthesia Assessment:                           - Prior to the procedure, a History and Physical                            was performed, and patient medications and                            allergies were reviewed. The patient's tolerance of                            previous anesthesia was also reviewed. The risks                            and benefits of the procedure and the sedation                            options and risks were discussed with the patient.                            All questions were answered, and informed consent                            was obtained. Prior Anticoagulants: The patient has                            taken no anticoagulant or antiplatelet agents. ASA                            Grade Assessment: III - A patient with severe                            systemic disease. After reviewing the risks and  benefits, the patient was deemed in satisfactory                            condition to undergo the procedure.                           After obtaining informed consent, the colonoscope                            was passed under direct vision. Throughout the                            procedure, the patient's blood pressure, pulse, and                            oxygen saturations were monitored  continuously. The                            Olympus Scope SN: [id number] was introduced through                            the anus and advanced to the cecum, identified by                            appendiceal orifice and ileocecal valve. The                            colonoscopy was performed without difficulty. The                            patient tolerated the procedure well. The quality                            of the bowel preparation was good. The ileocecal                            valve, the appendiceal orifice and the rectum were                            photographed. Scope In: 9:00:42 AM Scope Out: 9:13:48 AM Scope Withdrawal Time: 0 hours 7 minutes 18 seconds  Total Procedure Duration: 0 hours 13 minutes 6 seconds  Findings:                 Skin tags were found on perianal exam.                           The digital rectal exam was normal. Pertinent                            negatives include normal sphincter tone and no                            palpable rectal lesions.  The colon (entire examined portion) appeared normal.                           Internal hemorrhoids were found during retroflexion. Complications:            No immediate complications. Estimated blood loss:                            None. Estimated Blood Loss:     Estimated blood loss: none. Impression:               - Perianal skin tags found on perianal exam.                           - The entire examined colon is normal.                           - Internal hemorrhoids.                           - No specimens collected. Recommendation:           - Discharge patient to home (ambulatory).                           - Repeat colonoscopy in 10 years for surveillance.                           - The findings and recommendations were discussed                            with the patient's family.                           - Return to referring physician.                            - Patient has a contact number available for                            emergencies. The signs and symptoms of potential                            delayed complications were discussed with the                            patient. Return to normal activities tomorrow.                            Written discharge instructions were provided to the                            patient. Maren Beach, MD 09/22/2023 9:21:08 AM This report has been signed electronically.

## 2023-09-25 ENCOUNTER — Telehealth: Payer: Self-pay

## 2023-09-25 ENCOUNTER — Ambulatory Visit
Admission: RE | Admit: 2023-09-25 | Discharge: 2023-09-25 | Disposition: A | Discharge status: Home / Self Care | Payer: Self-pay | Source: Ambulatory Visit | Attending: Internal Medicine | Admitting: Internal Medicine

## 2023-09-25 DIAGNOSIS — E785 Hyperlipidemia, unspecified: Secondary | ICD-10-CM

## 2023-09-25 NOTE — Telephone Encounter (Signed)
  Follow up Call-     09/22/2023    8:25 AM  Call back number  Post procedure Call Back phone  # [phone]  Permission to leave phone message Yes     Patient questions:  Do you have a fever, pain , or abdominal swelling? No. Pain Score  0 *  Have you tolerated food without any problems? Yes.    Have you been able to return to your normal activities? Yes.    Do you have any questions about your discharge instructions: Diet   No. Medications  No. Follow up visit  No.  Do you have questions or concerns about your Care? Yes.    Actions: * If pain score is 4 or above: No action needed, pain <4.

## 2023-09-29 ENCOUNTER — Other Ambulatory Visit: Payer: BC Managed Care – PPO

## 2023-11-29 DIAGNOSIS — I10 Essential (primary) hypertension: Secondary | ICD-10-CM | POA: Diagnosis not present

## 2023-11-29 DIAGNOSIS — E785 Hyperlipidemia, unspecified: Secondary | ICD-10-CM | POA: Diagnosis not present

## 2023-12-06 NOTE — Progress Notes (Shared)
 [facility] - Clinic Note  12/20/2023   CHIEF COMPLAINT Patient presents for No chief complaint on file.  HISTORY OF PRESENT ILLNESS: Marie Barker is a 63 y.o. female who presents to the clinic today for:    Referring physician: Azalia Leo, MD [address] [place],  [place] [address]  HISTORICAL INFORMATION:  Selected notes from the MEDICAL RECORD NUMBER Referred by Dr. Steffanie Edouard for vitreous hemorrhage OD LEE:  Ocular Hx- PMH-   CURRENT MEDICATIONS: Current Outpatient Medications (Ophthalmic Drugs)  Medication Sig   neomycin-polymyxin-dexameth (MAXITROL ) 0.1 % OINT Place 1 Application into the right eye 4 (four) times daily. (Patient not taking: Reported on 06/14/2023)   No current facility-administered medications for this visit. (Ophthalmic Drugs)   Current Outpatient Medications (Other)  Medication Sig   atenolol (TENORMIN) 50 MG tablet Take 50 mg by mouth daily.   atorvastatin (LIPITOR) 20 MG tablet Take 20 mg by mouth at bedtime.   Cholecalciferol (VITAMIN D-3) 125 MCG (5000 UT) TABS Take by mouth.   EPINEPHrine 0.3 mg/0.3 mL IJ SOAJ injection USE AS DIRECTED AS NEEDED (Patient not taking: Reported on 09/22/2023)   hydrochlorothiazide (HYDRODIURIL) 25 MG tablet Take 25 mg by mouth every morning.   loratadine (CLARITIN) 10 MG tablet Take by mouth.   naproxen (NAPROSYN) 500 MG tablet SMARTSIG:1 Tablet(s) By Mouth Every 12 Hours PRN   No current facility-administered medications for this visit. (Other)   REVIEW OF SYSTEMS:   ALLERGIES Allergies  Allergen Reactions   Aspirin Nausea Only   PAST MEDICAL HISTORY Past Medical History:  Diagnosis Date   Allergy    seasonal   Arthritis    Hyperlipidemia    Hypertension    IBS (irritable bowel syndrome)    Morbid (severe) obesity due to excess calories (HCC) 09/22/2023   bmi 43.90   Tubular adenoma 08/09/2007   Past Surgical History:  Procedure Laterality Date   CESAREAN SECTION      CHOLECYSTECTOMY  1999   COLONOSCOPY     KNEE ARTHROSCOPY Right    POLYPECTOMY     FAMILY HISTORY Family History  Problem Relation Age of Onset   Hypertension Mother    Colon polyps Sister    Asthma Sister    Hypertension Sister    Heart disease Brother    Colon cancer Cousin        (father's sister's daughter()   Breast cancer Cousin    Esophageal cancer Neg Hx    SOCIAL HISTORY Social History   Tobacco Use   Smoking status: Never   Smokeless tobacco: Never  Vaping Use   Vaping status: Never Used  Substance Use Topics   Alcohol  use: Yes    Comment: ocassional   Drug use: No       OPHTHALMIC EXAM:  Not recorded    IMAGING AND PROCEDURES  Imaging and Procedures for 12/20/2023         ASSESSMENT/PLAN: No diagnosis found.  1-3. Retinal tear with VH OD - HST at 0230 with bridging vessels, +heme - s/p cryopexy OD (10.29.24) - good cryo changes around tear, but exam showed +cuff of SRF / focal RD OD on 11.06.24 - s/p laser retinopexy OD (11.06.24) -- good laser changes surrounding tear and SRF improved - VH improving on exam and OCT - BCVA OD 20/60 from 20/70 - VH precautions reviewed -- minimize activities, keep head elevated, avoid ASA/NSAIDs/blood thinners as able  - f/u in  -- DFE/OCT  4,5. Hypertensive retinopathy  OU - discussed importance of tight BP control - monitor   6. Mixed Cataract OU - The symptoms of cataract, surgical options, and treatments and risks were discussed with patient. - discussed diagnosis and progression - monitor   7. Amblyopia OD  Ophthalmic Meds Ordered this visit:  No orders of the defined types were placed in this encounter.    No follow-ups on file.  There are no Patient Instructions on file for this visit.  This document serves as a record of services personally performed by Jeanice Millard, MD, PhD. It was created on their behalf by Angelia Kelp, an ophthalmic technician. The creation of this record is the  provider's dictation and/or activities during the visit.    Electronically signed by: Angelia Kelp, OA, 12/06/23  10:29 AM    Jeanice Millard, M.D., Ph.D. Diseases & Surgery of the Retina and Vitreous [facility] 12/20/2023   Abbreviations: M myopia (nearsighted); A astigmatism; H hyperopia (farsighted); P presbyopia; Mrx spectacle prescription;  CTL contact lenses; OD right eye; OS left eye; OU both eyes  XT exotropia; ET esotropia; PEK punctate epithelial keratitis; PEE punctate epithelial erosions; DES dry eye syndrome; MGD meibomian gland dysfunction; ATs artificial tears; PFAT's preservative free artificial tears; NSC nuclear sclerotic cataract; PSC posterior subcapsular cataract; ERM epi-retinal membrane; PVD posterior vitreous detachment; RD retinal detachment; DM diabetes mellitus; DR diabetic retinopathy; NPDR non-proliferative diabetic retinopathy; PDR proliferative diabetic retinopathy; CSME clinically significant macular edema; DME diabetic macular edema; dbh dot blot hemorrhages; CWS cotton wool spot; POAG primary open angle glaucoma; C/D cup-to-disc ratio; HVF humphrey visual field; GVF goldmann visual field; OCT optical coherence tomography; IOP intraocular pressure; BRVO Branch retinal vein occlusion; CRVO central retinal vein occlusion; CRAO central retinal artery occlusion; BRAO branch retinal artery occlusion; RT retinal tear; SB scleral buckle; PPV pars plana vitrectomy; VH Vitreous hemorrhage; PRP panretinal laser photocoagulation; IVK intravitreal kenalog; VMT vitreomacular traction; MH Macular hole;  NVD neovascularization of the disc; NVE neovascularization elsewhere; AREDS age related eye disease study; ARMD age related macular degeneration; POAG primary open angle glaucoma; EBMD epithelial/anterior basement membrane dystrophy; ACIOL anterior chamber intraocular lens; IOL intraocular lens; PCIOL posterior chamber intraocular lens; Phaco/IOL  phacoemulsification with intraocular lens placement; PRK photorefractive keratectomy; LASIK laser assisted in situ keratomileusis; HTN hypertension; DM diabetes mellitus; COPD chronic obstructive pulmonary disease

## 2023-12-08 ENCOUNTER — Encounter (INDEPENDENT_AMBULATORY_CARE_PROVIDER_SITE_OTHER): Payer: Federal, State, Local not specified - PPO | Admitting: Ophthalmology

## 2023-12-08 NOTE — Progress Notes (Signed)
 [facility] - Clinic Note  12/18/2023   CHIEF COMPLAINT Patient presents for Retina Follow Up  HISTORY OF PRESENT ILLNESS: Marie Barker is a 63 y.o. female who presents to the clinic today for:  HPI     Retina Follow Up   Patient presents with  Retinal Break/Detachment.  In right eye.  Severity is moderate.  Duration of 3 months.  Since onset it is stable.  I, the attending physician,  performed the HPI with the patient and updated documentation appropriately.        Comments   Pt here for 3 mo ret f/u for RT OD. Pt states VA is stable, still sees some floaters though none that are new.       Last edited by Ronelle Coffee, MD on 12/18/2023  1:35 PM.    Pt states she still sees a few floaters every now and then  Referring physician: Azalia Leo, MD [address] [place],  [place] [address]  HISTORICAL INFORMATION:  Selected notes from the MEDICAL RECORD NUMBER Referred by Dr. Steffanie Edouard for vitreous hemorrhage OD LEE:  Ocular Hx- PMH-   CURRENT MEDICATIONS: Current Outpatient Medications (Ophthalmic Drugs)  Medication Sig   neomycin-polymyxin-dexameth (MAXITROL ) 0.1 % OINT Place 1 Application into the right eye 4 (four) times daily. (Patient not taking: Reported on 12/18/2023)   No current facility-administered medications for this visit. (Ophthalmic Drugs)   Current Outpatient Medications (Other)  Medication Sig   atenolol (TENORMIN) 50 MG tablet Take 50 mg by mouth daily.   atorvastatin (LIPITOR) 20 MG tablet Take 20 mg by mouth at bedtime.   Cholecalciferol (VITAMIN D-3) 125 MCG (5000 UT) TABS Take by mouth.   EPINEPHrine 0.3 mg/0.3 mL IJ SOAJ injection    ezetimibe (ZETIA) 10 MG tablet Take 10 mg by mouth daily.   hydrochlorothiazide (HYDRODIURIL) 25 MG tablet Take 25 mg by mouth every morning.   loratadine (CLARITIN) 10 MG tablet Take by mouth.   naproxen (NAPROSYN) 500 MG tablet SMARTSIG:1 Tablet(s) By Mouth Every 12 Hours PRN    Semaglutide-Weight Management (WEGOVY) 0.25 MG/0.5ML SOAJ Inject 0.25 mg into the skin once a week.   No current facility-administered medications for this visit. (Other)   REVIEW OF SYSTEMS: ROS   Positive for: Cardiovascular, Eyes Negative for: Constitutional, Gastrointestinal, Neurological, Skin, Genitourinary, Musculoskeletal, HENT, Endocrine, Respiratory, Psychiatric, Allergic/Imm, Heme/Lymph Last edited by Anthony Bateman, COT on 12/18/2023 12:57 PM.      ALLERGIES Allergies  Allergen Reactions   Aspirin Nausea Only   PAST MEDICAL HISTORY Past Medical History:  Diagnosis Date   Allergy    seasonal   Amblyopia    Arthritis    Hyperlipidemia    Hypertension    IBS (irritable bowel syndrome)    Morbid (severe) obesity due to excess calories (HCC) 09/22/2023   bmi 43.90   Tubular adenoma 08/09/2007   Past Surgical History:  Procedure Laterality Date   CESAREAN SECTION     CHOLECYSTECTOMY  1999   COLONOSCOPY     KNEE ARTHROSCOPY Right    POLYPECTOMY     FAMILY HISTORY Family History  Problem Relation Age of Onset   Hypertension Mother    Colon polyps Sister    Asthma Sister    Hypertension Sister    Heart disease Brother    Colon cancer Cousin        (father's sister's daughter()   Breast cancer Cousin    Esophageal cancer Neg Hx  SOCIAL HISTORY Social History   Tobacco Use   Smoking status: Never   Smokeless tobacco: Never  Vaping Use   Vaping status: Never Used  Substance Use Topics   Alcohol  use: Yes    Comment: ocassional   Drug use: No       OPHTHALMIC EXAM:  Base Eye Exam     Visual Acuity (Snellen - Linear)       Right Left   Dist [place] 20/80 -2 20/25   Dist ph [place] 20/80 +1     Correction: Contacts  CTL just in OS MS        Tonometry (Tonopen, 1:06 PM)       Right Left   Pressure 18 15         Pupils       Pupils Dark Light Shape React APD   Right PERRL 3 2 Round Brisk None   Left PERRL 3 2 Round Brisk None          Visual Fields (Counting fingers)       Left Right    Full Full         Extraocular Movement       Right Left    Full, Ortho Full, Ortho         Neuro/Psych     Oriented x3: Yes   Mood/Affect: Normal         Dilation     Both eyes: 1.0% Mydriacyl, 2.5% Phenylephrine @ 1:07 PM           Slit Lamp and Fundus Exam     External Exam       Right Left   External Normal Normal         Slit Lamp Exam       Right Left   Lids/Lashes Dermatochalasis - upper lid Dermatochalasis - upper lid   Conjunctiva/Sclera Melanosis, SCH -- resolved Melanosis   Cornea Trace PEE, tear film debris trace Punctate epithelial erosions, mild arcus   Anterior Chamber Deep and clear Deep and clear   Iris Round and dilated Round and reactive   Lens 2+ Nuclear sclerosis, 2-3+ Cortical cataract 2+ Nuclear sclerosis, 2-3+ Cortical cataract   Anterior Vitreous Vitreous syneresis, +RBC/pigment, blood stained vitreous condensations -- turning white, improving and settling inferiorly, old white VH settled inferiorly, Posterior vitreous detachment Vitreous syneresis         Fundus Exam       Right Left   Disc Pink and Sharp, PPP Pink and sharp, temp PPP   C/D Ratio 0.4 0.3   Macula Flat, good foveal reflex, No heme or edema Flat, Good foveal reflex, RPE mottling, no heme or edema   Vessels attenuated, mild tortuosity attenuated, Tortuous   Periphery HST at 0230 w/ good cryo changes surrounding -- +shallow cuff of SRF / focal RD -- improved, good laser changes surrounding tear, focal lattice 0600 with good laser changes, no new RT/RD/lattice, inferior paving stone degeneration attached; no heme           IMAGING AND PROCEDURES  Imaging and Procedures for 12/18/2023  OCT, Retina - OU - Both Eyes        Right Eye Quality was good. Central Foveal Thickness: 237. Progression has been stable. Findings include normal foveal contour, no IRF, no SRF (Trace persistent vitreous  opacities).   Left Eye Quality was good. Central Foveal Thickness: 227. Progression has been stable. Findings include normal foveal contour, no IRF, no SRF.  Notes  *Images captured and stored on drive  Diagnosis / Impression:  NFP; no IRF/SRF OU OD: Trace persistent vitreous opacities   Clinical management:  See below  Abbreviations: NFP - Normal foveal profile. CME - cystoid macular edema. PED - pigment epithelial detachment. IRF - intraretinal fluid. SRF - subretinal fluid. EZ - ellipsoid zone. ERM - epiretinal membrane. ORA - outer retinal atrophy. ORT - outer retinal tubulation. SRHM - subretinal hyper-reflective material. IRHM - intraretinal hyper-reflective material           ASSESSMENT/PLAN:   ICD-10-CM   1. Retinal tear of right eye  H33.311     2. Vitreous hemorrhage, right eye (HCC)  H43.11     3. Right retinal detachment  H33.21     4. Hypertensive retinopathy of both eyes  H35.033 OCT, Retina - OU - Both Eyes    5. Essential hypertension  I10     6. Combined forms of age-related cataract of both eyes  H25.813     7. Amblyopia of right eye  H53.001      1-3. Retinal tear with VH OD - HST at 0230 with bridging vessels, +heme - s/p cryopexy OD (10.29.24) - good cryo changes around tear, but exam showed +cuff of SRF / focal RD OD on 11.06.24 - s/p laser retinopexy OD (11.06.24) -- good laser changes surrounding tear and SRF improved - VH improved on exam and OCT - BCVA OD 20/80 (amblyopia OD) - VH precautions reviewed -- minimize activities, keep head elevated, avoid ASA/NSAIDs/blood thinners as able  - f/u in 9-12 months -- DFE/OCT  4,5. Hypertensive retinopathy OU - discussed importance of tight BP control - monitor   6. Mixed Cataract OU - The symptoms of cataract, surgical options, and treatments and risks were discussed with patient. - discussed diagnosis and progression - monitor   7. Amblyopia OD  - baseline BCVA ~20/80 OD  Ophthalmic  Meds Ordered this visit:  No orders of the defined types were placed in this encounter.    Return for f/u 9-12 months, retinal tear OD, DFE, OCT.  There are no Patient Instructions on file for this visit.  This document serves as a record of services personally performed by Jeanice Millard, MD, PhD. It was created on their behalf by Morley Arabia. Bevin Bucks, OA an ophthalmic technician. The creation of this record is the provider's dictation and/or activities during the visit.    Electronically signed by: Morley Arabia. Bevin Bucks, OA 12/19/23 11:45 PM  Jeanice Millard, M.D., Ph.D. Diseases & Surgery of the Retina and Vitreous [facility] 12/18/2023  I have reviewed the above documentation for accuracy and completeness, and I agree with the above. Jeanice Millard, M.D., Ph.D. 12/19/23 11:47 PM   Abbreviations: M myopia (nearsighted); A astigmatism; H hyperopia (farsighted); P presbyopia; Mrx spectacle prescription;  CTL contact lenses; OD right eye; OS left eye; OU both eyes  XT exotropia; ET esotropia; PEK punctate epithelial keratitis; PEE punctate epithelial erosions; DES dry eye syndrome; MGD meibomian gland dysfunction; ATs artificial tears; PFAT's preservative free artificial tears; NSC nuclear sclerotic cataract; PSC posterior subcapsular cataract; ERM epi-retinal membrane; PVD posterior vitreous detachment; RD retinal detachment; DM diabetes mellitus; DR diabetic retinopathy; NPDR non-proliferative diabetic retinopathy; PDR proliferative diabetic retinopathy; CSME clinically significant macular edema; DME diabetic macular edema; dbh dot blot hemorrhages; CWS cotton wool spot; POAG primary open angle glaucoma; C/D cup-to-disc ratio; HVF humphrey visual field; GVF goldmann visual field; OCT optical coherence  tomography; IOP intraocular pressure; BRVO Branch retinal vein occlusion; CRVO central retinal vein occlusion; CRAO central retinal artery occlusion; BRAO branch retinal artery  occlusion; RT retinal tear; SB scleral buckle; PPV pars plana vitrectomy; VH Vitreous hemorrhage; PRP panretinal laser photocoagulation; IVK intravitreal kenalog; VMT vitreomacular traction; MH Macular hole;  NVD neovascularization of the disc; NVE neovascularization elsewhere; AREDS age related eye disease study; ARMD age related macular degeneration; POAG primary open angle glaucoma; EBMD epithelial/anterior basement membrane dystrophy; ACIOL anterior chamber intraocular lens; IOL intraocular lens; PCIOL posterior chamber intraocular lens; Phaco/IOL phacoemulsification with intraocular lens placement; PRK photorefractive keratectomy; LASIK laser assisted in situ keratomileusis; HTN hypertension; DM diabetes mellitus; COPD chronic obstructive pulmonary disease

## 2023-12-14 DIAGNOSIS — M545 Low back pain, unspecified: Secondary | ICD-10-CM | POA: Diagnosis not present

## 2023-12-14 DIAGNOSIS — M170 Bilateral primary osteoarthritis of knee: Secondary | ICD-10-CM | POA: Diagnosis not present

## 2023-12-18 ENCOUNTER — Ambulatory Visit (INDEPENDENT_AMBULATORY_CARE_PROVIDER_SITE_OTHER): Admitting: Ophthalmology

## 2023-12-18 ENCOUNTER — Encounter (INDEPENDENT_AMBULATORY_CARE_PROVIDER_SITE_OTHER): Payer: Self-pay | Admitting: Ophthalmology

## 2023-12-18 DIAGNOSIS — H33311 Horseshoe tear of retina without detachment, right eye: Secondary | ICD-10-CM | POA: Diagnosis not present

## 2023-12-18 DIAGNOSIS — H4311 Vitreous hemorrhage, right eye: Secondary | ICD-10-CM | POA: Diagnosis not present

## 2023-12-18 DIAGNOSIS — H35033 Hypertensive retinopathy, bilateral: Secondary | ICD-10-CM | POA: Diagnosis not present

## 2023-12-18 DIAGNOSIS — H53001 Unspecified amblyopia, right eye: Secondary | ICD-10-CM

## 2023-12-18 DIAGNOSIS — H3321 Serous retinal detachment, right eye: Secondary | ICD-10-CM | POA: Diagnosis not present

## 2023-12-18 DIAGNOSIS — I10 Essential (primary) hypertension: Secondary | ICD-10-CM | POA: Diagnosis not present

## 2023-12-18 DIAGNOSIS — H25813 Combined forms of age-related cataract, bilateral: Secondary | ICD-10-CM

## 2023-12-20 ENCOUNTER — Encounter (INDEPENDENT_AMBULATORY_CARE_PROVIDER_SITE_OTHER): Admitting: Ophthalmology

## 2023-12-20 DIAGNOSIS — H35033 Hypertensive retinopathy, bilateral: Secondary | ICD-10-CM

## 2023-12-20 DIAGNOSIS — I10 Essential (primary) hypertension: Secondary | ICD-10-CM

## 2023-12-20 DIAGNOSIS — H53001 Unspecified amblyopia, right eye: Secondary | ICD-10-CM

## 2023-12-20 DIAGNOSIS — H25813 Combined forms of age-related cataract, bilateral: Secondary | ICD-10-CM

## 2023-12-20 DIAGNOSIS — H33311 Horseshoe tear of retina without detachment, right eye: Secondary | ICD-10-CM

## 2023-12-20 DIAGNOSIS — H4311 Vitreous hemorrhage, right eye: Secondary | ICD-10-CM

## 2023-12-20 DIAGNOSIS — H3321 Serous retinal detachment, right eye: Secondary | ICD-10-CM

## 2024-02-01 DIAGNOSIS — Z6841 Body Mass Index (BMI) 40.0 and over, adult: Secondary | ICD-10-CM | POA: Diagnosis not present

## 2024-02-01 DIAGNOSIS — Z01419 Encounter for gynecological examination (general) (routine) without abnormal findings: Secondary | ICD-10-CM | POA: Diagnosis not present

## 2024-02-26 ENCOUNTER — Encounter (HOSPITAL_BASED_OUTPATIENT_CLINIC_OR_DEPARTMENT_OTHER): Payer: Self-pay | Admitting: Internal Medicine

## 2024-02-26 ENCOUNTER — Ambulatory Visit (HOSPITAL_BASED_OUTPATIENT_CLINIC_OR_DEPARTMENT_OTHER): Admitting: Internal Medicine

## 2024-02-26 VITALS — BP 136/84 | HR 73 | Ht 61.0 in | Wt 239.0 lb

## 2024-02-26 DIAGNOSIS — E785 Hyperlipidemia, unspecified: Secondary | ICD-10-CM | POA: Diagnosis not present

## 2024-02-26 DIAGNOSIS — E7841 Elevated Lipoprotein(a): Secondary | ICD-10-CM

## 2024-02-26 DIAGNOSIS — R931 Abnormal findings on diagnostic imaging of heart and coronary circulation: Secondary | ICD-10-CM

## 2024-02-26 MED ORDER — ATORVASTATIN CALCIUM 40 MG PO TABS: 40.0000 mg | ORAL_TABLET | Freq: Every day | ORAL | Status: AC

## 2024-02-26 NOTE — Patient Instructions (Signed)
 Medication Instructions:  Your physician recommends that you continue on your current medications as directed. Please refer to the Current Medication list given to you today.  *If you need a refill on your cardiac medications before your next appointment, please call your pharmacy*  Lab Work: FASTING Lipids-NMR as soon as able If you have labs (blood work) drawn today and your tests are completely normal, you will receive your results only by: MyChart Message (if you have MyChart) OR A paper copy in the mail If you have any lab test that is abnormal or we need to change your treatment, we will call you to review the results.  Follow-Up: At [facility], you and your health needs are our priority.  As part of our continuing mission to provide you with exceptional heart care, our providers are all part of one team.  This team includes your primary Cardiologist (physician) and Advanced Practice Providers or APPs (Physician Assistants and Nurse Practitioners) who all work together to provide you with the care you need, when you need it.  Your next appointment:   TBD- based on lab results

## 2024-02-26 NOTE — Progress Notes (Signed)
 LIPID CLINIC CONSULT NOTE  Chief Complaint:  Manage dyslipidemia  Primary Care Physician: Stephane Leita DEL, MD  Primary Cardiologist:  None  HPI:  Siboney Requejo is a 63 y.o. female who is being seen today for the evaluation of dyslipidemia at the request of Wile, Leita DEL, MD. is a pleasant 63 year old female kindly referred for evaluation management of dyslipidemia.  She has a history of high cholesterol in the past but has been on lipid-lowering therapy including atorvastatin  recently increased from 20 to 40 mg daily in the past 3 to 6 months as well as addition of ezetimibe 10 mg daily.  She is on 81 mg aspirin daily.  There is a strong family history of heart disease including her brother who had an MI at 1 and her mom who had an MI as well.  She recently underwent additional testing was found to have an elevated LP(a) of 246 nmol/L.  She is also had a coronary calcium  score.  This was performed in February 2025 which showed a calcium  score of 620, 99th percentile for age and sex matched controls.  Aortic atherosclerosis was also noted.  Recent lipid showed total cholesterol 148, triglycerides 52, HDL 47 and LDL 91.  PMHx:  Past Medical History:  Diagnosis Date   Allergy    seasonal   Amblyopia    Arthritis    Hyperlipidemia    Hypertension    IBS (irritable bowel syndrome)    Morbid (severe) obesity due to excess calories (HCC) 09/22/2023   bmi 43.90   Tubular adenoma 08/09/2007    Past Surgical History:  Procedure Laterality Date   CESAREAN SECTION     CHOLECYSTECTOMY  1999   COLONOSCOPY     KNEE ARTHROSCOPY Right    POLYPECTOMY      FAMHx:  Family History  Problem Relation Age of Onset   Hypertension Mother    Colon polyps Sister    Asthma Sister    Hypertension Sister    Heart disease Brother    Colon cancer Cousin        (father's sister's daughter()   Breast cancer Cousin    Esophageal cancer Neg Hx     SOCHx:   reports that she has never smoked.  She has never used smokeless tobacco. She reports current alcohol  use. She reports that she does not use drugs.  ALLERGIES:  Allergies  Allergen Reactions   Aspirin Nausea Only    ROS: Pertinent items noted in HPI and remainder of comprehensive ROS otherwise negative.  HOME MEDS: Current Outpatient Medications on File Prior to Visit  Medication Sig Dispense Refill   aspirin EC 81 MG tablet Take 81 mg by mouth daily. Swallow whole.     atenolol (TENORMIN) 50 MG tablet Take 50 mg by mouth daily.     Cholecalciferol (VITAMIN D-3) 125 MCG (5000 UT) TABS Take by mouth.     EPINEPHrine 0.3 mg/0.3 mL IJ SOAJ injection      ezetimibe (ZETIA) 10 MG tablet Take 10 mg by mouth daily.     hydrochlorothiazide (HYDRODIURIL) 25 MG tablet Take 25 mg by mouth every morning.     loratadine (CLARITIN) 10 MG tablet Take by mouth.     naproxen (NAPROSYN) 500 MG tablet SMARTSIG:1 Tablet(s) By Mouth Every 12 Hours PRN     Semaglutide-Weight Management (WEGOVY) 0.25 MG/0.5ML SOAJ Inject 0.25 mg into the skin once a week.     neomycin-polymyxin-dexameth (MAXITROL ) 0.1 % OINT Place 1 Application into  the right eye 4 (four) times daily. (Patient not taking: Reported on 02/26/2024) 3.5 g 1   No current facility-administered medications on file prior to visit.    LABS/IMAGING: No results found for this or any previous visit (from the past 48 hours). No results found.  LIPID PANEL: No results found for: CHOL, TRIG, HDL, CHOLHDL, VLDL, LDLCALC, LDLDIRECT  No results found for: LIPOA   WEIGHTS: Wt Readings from Last 3 Encounters:  02/26/24 239 lb (108.4 kg)  09/22/23 240 lb (108.9 kg)  08/31/23 240 lb (108.9 kg)    VITALS: BP 136/84   Pulse 73   Ht 5' 1 (1.549 m)   Wt 239 lb (108.4 kg)   LMP 03/04/2013   SpO2 99%   BMI 45.16 kg/m   EXAM: Deferred  EKG: Deferred  ASSESSMENT: Dyslipidemia, goal LDL less than 55 (very high risk) CAC score of 620, 99th percentile for age and  sex matched controls (09/2023) Strong family history of premature coronary disease Elevated LP(a)-246 nmol/L  PLAN: 1.   Ms. Foskey has a dyslipidemia with an LDL target less than 55.  Recent cholesterol was elevated with an LDL 91 however she had an increase in her statin and the addition of ezetimibe.  I will plan to repeat lipids to see if she is reached target.  If she is not she may be a candidate for PCSK9 inhibitor to further lower LDL cholesterol with some benefit in LP(a) lowering.  Fortunately, even given her high calcium  score she is active and says she is asymptomatic.  Thanks again for the kind referral.  Vinie KYM Maxcy, MD, [facility], FNLA, FACP  [place]  [facility]  Medical Director of the Advanced Lipid Disorders &  Cardiovascular Risk Reduction Clinic Diplomate of the American Board of Clinical Lipidology Attending Cardiologist  Direct Dial: [phone]  Fax: [phone]  Website:  [email or web address] Vinie BROCKS Sherrine Salberg 02/26/2024, 4:22 PM

## 2024-02-27 DIAGNOSIS — B372 Candidiasis of skin and nail: Secondary | ICD-10-CM | POA: Diagnosis not present

## 2024-02-27 DIAGNOSIS — R21 Rash and other nonspecific skin eruption: Secondary | ICD-10-CM | POA: Diagnosis not present

## 2024-02-29 DIAGNOSIS — E785 Hyperlipidemia, unspecified: Secondary | ICD-10-CM | POA: Diagnosis not present

## 2024-03-01 LAB — NMR, LIPOPROFILE
Cholesterol, Total: 139 mg/dL (ref 100–199)
HDL Particle Number: 31.6 umol/L (ref >=30.5)
HDL-C: 56 mg/dL (ref >39)
LDL Particle Number: 872 nmol/L (ref <1000)
LDL Size: 20.9 nm (ref >20.5)
LDL-C (NIH Calc): 72 mg/dL (ref 0–99)
LP-IR Score: <25 (ref <=45)
Small LDL Particle Number: 348 nmol/L (ref <=527)
Triglycerides: 52 mg/dL (ref 0–149)

## 2024-03-05 ENCOUNTER — Ambulatory Visit: Payer: Self-pay | Admitting: Internal Medicine

## 2024-03-06 ENCOUNTER — Telehealth: Payer: Self-pay | Admitting: Pharmacy Technician

## 2024-03-06 ENCOUNTER — Other Ambulatory Visit (HOSPITAL_COMMUNITY): Payer: Self-pay

## 2024-03-06 NOTE — Telephone Encounter (Signed)
 Waiting on fax

## 2024-03-06 NOTE — Telephone Encounter (Signed)
 From staff  Pharmacy Patient Advocate Encounter   Received notification from staff that prior authorization for repatha is required/requested.   Insurance verification completed.   The patient is insured through [employer] [facility] .   Per test claim: PA required; PA submitted to above mentioned insurance via latent Key/confirmation #/EOC [id number] Status is pending

## 2024-03-07 NOTE — Telephone Encounter (Signed)
  Pharmacy Patient Advocate Encounter  Received notification from [employer] [facility] that Prior Authorization for repatha has been APPROVED from 03/07/24 to 03/07/25   Hi, I do not see a prescription on file to notify the pharmacy to fill now so sending to you. Thank you!

## 2024-03-13 MED ORDER — REPATHA SURECLICK 140 MG/ML ~~LOC~~ SOAJ: 140.0000 mg | SUBCUTANEOUS | 3 refills | Status: AC

## 2024-03-13 NOTE — Telephone Encounter (Signed)
 Patient returned call. Notified patient about Repatha  approval and sent prescription electronically.

## 2024-03-13 NOTE — Addendum Note (Signed)
 Addended by: ANDREZ PRAIRIE on: 03/13/2024 11:49 AM   Modules accepted: Orders

## 2024-03-13 NOTE — Telephone Encounter (Signed)
 Left message for patient to call back regarding Repatha  and preferred pharmacy.

## 2024-03-18 DIAGNOSIS — I10 Essential (primary) hypertension: Secondary | ICD-10-CM | POA: Diagnosis not present

## 2024-03-20 ENCOUNTER — Other Ambulatory Visit: Payer: Self-pay | Admitting: Internal Medicine

## 2024-03-20 DIAGNOSIS — Z1231 Encounter for screening mammogram for malignant neoplasm of breast: Secondary | ICD-10-CM

## 2024-03-27 DIAGNOSIS — I10 Essential (primary) hypertension: Secondary | ICD-10-CM | POA: Diagnosis not present

## 2024-03-27 DIAGNOSIS — L309 Dermatitis, unspecified: Secondary | ICD-10-CM | POA: Diagnosis not present

## 2024-03-27 DIAGNOSIS — I2510 Atherosclerotic heart disease of native coronary artery without angina pectoris: Secondary | ICD-10-CM | POA: Diagnosis not present

## 2024-04-04 ENCOUNTER — Encounter: Payer: Self-pay | Admitting: Internal Medicine

## 2024-04-04 ENCOUNTER — Ambulatory Visit
Admission: RE | Admit: 2024-04-04 | Discharge: 2024-04-04 | Disposition: A | Discharge status: Home / Self Care | Source: Ambulatory Visit | Attending: Internal Medicine

## 2024-04-04 DIAGNOSIS — Z1231 Encounter for screening mammogram for malignant neoplasm of breast: Secondary | ICD-10-CM

## 2024-05-28 DIAGNOSIS — M170 Bilateral primary osteoarthritis of knee: Secondary | ICD-10-CM | POA: Diagnosis not present

## 2024-06-17 DIAGNOSIS — I10 Essential (primary) hypertension: Secondary | ICD-10-CM | POA: Diagnosis not present

## 2024-06-20 ENCOUNTER — Encounter: Payer: Self-pay | Admitting: *Deleted

## 2024-06-20 DIAGNOSIS — Z006 Encounter for examination for normal comparison and control in clinical research program: Secondary | ICD-10-CM

## 2024-06-20 NOTE — Research (Signed)
 Spoke with Marie Barker about pre-event research study.  She request more info. Emailed consents to her to read over. Encouraged her to call with any questions.

## 2024-06-26 ENCOUNTER — Encounter: Payer: Self-pay | Admitting: *Deleted

## 2024-06-26 DIAGNOSIS — Z006 Encounter for examination for normal comparison and control in clinical research program: Secondary | ICD-10-CM

## 2024-06-26 NOTE — Research (Signed)
 Spoke with Marie Barker about pre-event research study. She states she has not read over it yet. She states she will and call be back.

## 2024-07-22 ENCOUNTER — Encounter: Payer: Self-pay | Admitting: *Deleted

## 2024-07-22 DIAGNOSIS — Z006 Encounter for examination for normal comparison and control in clinical research program: Secondary | ICD-10-CM

## 2024-07-22 NOTE — Research (Signed)
 Spoke with Ms Gabor about pre-event research. She states she wants to look over the information. Informed her I will call after the holidays to follow up and see if she would like to come in for screening. Voices understanding.

## 2024-07-26 DIAGNOSIS — E785 Hyperlipidemia, unspecified: Secondary | ICD-10-CM | POA: Diagnosis not present

## 2024-07-26 DIAGNOSIS — Z1389 Encounter for screening for other disorder: Secondary | ICD-10-CM | POA: Diagnosis not present

## 2024-08-06 DIAGNOSIS — I10 Essential (primary) hypertension: Secondary | ICD-10-CM | POA: Diagnosis not present

## 2024-08-06 DIAGNOSIS — Z1339 Encounter for screening examination for other mental health and behavioral disorders: Secondary | ICD-10-CM | POA: Diagnosis not present

## 2024-08-06 DIAGNOSIS — Z1331 Encounter for screening for depression: Secondary | ICD-10-CM | POA: Diagnosis not present

## 2024-08-06 DIAGNOSIS — Z0000 Encounter for general adult medical examination without abnormal findings: Secondary | ICD-10-CM | POA: Diagnosis not present

## 2024-08-14 ENCOUNTER — Encounter: Payer: Self-pay | Admitting: *Deleted

## 2024-08-14 DIAGNOSIS — Z006 Encounter for examination for normal comparison and control in clinical research program: Secondary | ICD-10-CM

## 2024-08-14 NOTE — Research (Signed)
 Message left for Marie Barker to see if she has any questions about the pre-event research study, or if she is interested in coming in for screening. Encouraged her to call.

## 2024-09-03 NOTE — Progress Notes (Shared)
 " [facility] - Clinic Note  09/16/2024   CHIEF COMPLAINT Patient presents for No chief complaint on file.  HISTORY OF PRESENT ILLNESS: Marie Barker is a 64 y.o. female who presents to the clinic today for:   Pt states she still sees a few floaters every now and then  Referring physician: Stephane Leita DEL, MD [address] [place],  [place] [address]  HISTORICAL INFORMATION:  Selected notes from the MEDICAL RECORD NUMBER Referred by Dr. Paul for vitreous hemorrhage OD LEE:  Ocular Hx- PMH-   CURRENT MEDICATIONS: Current Outpatient Medications (Ophthalmic Drugs)  Medication Sig   neomycin-polymyxin-dexameth (MAXITROL ) 0.1 % OINT Place 1 Application into the right eye 4 (four) times daily. (Patient not taking: Reported on 02/26/2024)   No current facility-administered medications for this visit. (Ophthalmic Drugs)   Current Outpatient Medications (Other)  Medication Sig   aspirin EC 81 MG tablet Take 81 mg by mouth daily. Swallow whole.   atenolol (TENORMIN) 50 MG tablet Take 50 mg by mouth daily.   atorvastatin  (LIPITOR) 40 MG tablet Take 1 tablet (40 mg total) by mouth daily.   Cholecalciferol (VITAMIN D-3) 125 MCG (5000 UT) TABS Take by mouth.   EPINEPHrine 0.3 mg/0.3 mL IJ SOAJ injection    Evolocumab  (REPATHA  SURECLICK) 140 MG/ML SOAJ Inject 140 mg into the skin every 14 (fourteen) days.   ezetimibe (ZETIA) 10 MG tablet Take 10 mg by mouth daily.   hydrochlorothiazide (HYDRODIURIL) 25 MG tablet Take 25 mg by mouth every morning.   loratadine (CLARITIN) 10 MG tablet Take by mouth.   naproxen (NAPROSYN) 500 MG tablet SMARTSIG:1 Tablet(s) By Mouth Every 12 Hours PRN   Semaglutide-Weight Management (WEGOVY) 0.25 MG/0.5ML SOAJ Inject 0.25 mg into the skin once a week.   No current facility-administered medications for this visit. (Other)   REVIEW OF SYSTEMS:    ALLERGIES Allergies  Allergen Reactions   Aspirin Nausea Only   PAST MEDICAL  HISTORY Past Medical History:  Diagnosis Date   Allergy    seasonal   Amblyopia    Arthritis    Hyperlipidemia    Hypertension    IBS (irritable bowel syndrome)    Morbid (severe) obesity due to excess calories (HCC) 09/22/2023   bmi 43.90   Tubular adenoma 08/09/2007   Past Surgical History:  Procedure Laterality Date   CESAREAN SECTION     CHOLECYSTECTOMY  1999   COLONOSCOPY     KNEE ARTHROSCOPY Right    POLYPECTOMY     FAMILY HISTORY Family History  Problem Relation Age of Onset   Hypertension Mother    Colon polyps Sister    Asthma Sister    Hypertension Sister    Heart disease Brother    Colon cancer Cousin        (father's sister's daughter()   Breast cancer Cousin    Esophageal cancer Neg Hx    SOCIAL HISTORY Social History   Tobacco Use   Smoking status: Never   Smokeless tobacco: Never  Vaping Use   Vaping status: Never Used  Substance Use Topics   Alcohol  use: Yes    Comment: ocassional   Drug use: No       OPHTHALMIC EXAM:  Not recorded    IMAGING AND PROCEDURES  Imaging and Procedures for 09/16/2024         ASSESSMENT/PLAN:   ICD-10-CM   1. Retinal tear of right eye  H33.311     2. Vitreous hemorrhage, right eye (  HCC)  H43.11     3. 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 Hypertensive retinopathy of both eyes  H35.033     5. Essential hypertension  I10     6. Combined forms of age-related cataract of both eyes  H25.813     7. Amblyopia of right eye  H53.001       1-3. Retinal tear with VH OD - HST at 0230 with bridging vessels, +heme - s/p cryopexy OD (10.29.24) - good cryo changes around tear, but exam showed +cuff of SRF / focal RD OD on 11.06.24 - s/p laser retinopexy OD (11.06.24) -- good laser changes surrounding tear and SRF improved - VH improved on exam and OCT - BCVA OD 20/80 (amblyopia OD) - VH precautions reviewed -- minimize activities, keep head elevated, avoid  ASA/NSAIDs/blood thinners as able  - f/u in 9-12 months -- DFE/OCT  4,5. Hypertensive retinopathy OU - discussed importance of tight BP control - monitor   6. Mixed Cataract OU - The symptoms of cataract, surgical options, and treatments and risks were discussed with patient. - discussed diagnosis and progression - monitor   7. Amblyopia OD  - baseline BCVA ~20/80 OD  Ophthalmic Meds Ordered this visit:  No orders of the defined types were placed in this encounter.    No follow-ups on file.  There are no Patient Instructions on file for this visit.  This document serves as a record of services personally performed by [name], MD, PhD. It was created on their behalf by [name] an ophthalmic technician. The creation of this record is the provider's dictation and/or activities during the visit.   Electronically signed by: Paulina JONETTA Gay  09/03/24  3:15 PM   [name], M.D., Ph.D. Diseases & Surgery of the Retina and Vitreous [facility] 09/16/2024   Abbreviations: M myopia (nearsighted); A astigmatism; H hyperopia (farsighted); P presbyopia; Mrx spectacle prescription;  CTL contact lenses; OD right eye; OS left eye; OU both eyes  XT exotropia; ET esotropia; PEK punctate epithelial keratitis; PEE punctate epithelial erosions; DES dry eye syndrome; MGD meibomian gland dysfunction; ATs artificial tears; PFAT's preservative free artificial tears; NSC nuclear sclerotic cataract; PSC posterior subcapsular cataract; ERM epi-retinal membrane; PVD posterior vitreous detachment; RD retinal detachment; DM diabetes mellitus; DR diabetic retinopathy; NPDR non-proliferative diabetic retinopathy; PDR proliferative diabetic retinopathy; CSME clinically significant macular edema; DME diabetic macular edema; dbh dot blot hemorrhages; CWS cotton wool spot; POAG primary open angle glaucoma; C/D cup-to-disc ratio; HVF humphrey visual field; GVF goldmann visual  field; OCT optical coherence tomography; IOP intraocular pressure; BRVO Branch retinal vein occlusion; CRVO central retinal vein occlusion; CRAO central retinal artery occlusion; BRAO branch retinal artery occlusion; RT retinal tear; SB scleral buckle; PPV pars plana vitrectomy; VH Vitreous hemorrhage; PRP panretinal laser photocoagulation; IVK intravitreal kenalog; VMT vitreomacular traction; MH Macular hole;  NVD neovascularization of the disc; NVE neovascularization elsewhere; AREDS age related eye disease study; ARMD age related macular degeneration; POAG primary open angle glaucoma; EBMD epithelial/anterior basement membrane dystrophy; ACIOL anterior chamber intraocular lens; IOL intraocular lens; PCIOL posterior chamber intraocular lens; Phaco/IOL phacoemulsification with intraocular lens placement; PRK photorefractive keratectomy; LASIK laser assisted in situ keratomileusis; HTN hypertension; DM diabetes mellitus; COPD chronic obstructive pulmonary disease  "

## 2024-09-16 ENCOUNTER — Encounter (INDEPENDENT_AMBULATORY_CARE_PROVIDER_SITE_OTHER): Admitting: Ophthalmology

## 2024-09-16 DIAGNOSIS — H4311 Vitreous hemorrhage, right eye: Secondary | ICD-10-CM

## 2024-09-16 DIAGNOSIS — H33311 Horseshoe tear of retina without detachment, right eye: Secondary | ICD-10-CM

## 2024-09-16 DIAGNOSIS — H25813 Combined forms of age-related cataract, bilateral: Secondary | ICD-10-CM

## 2024-09-16 DIAGNOSIS — H53001 Unspecified amblyopia, right eye: Secondary | ICD-10-CM

## 2024-09-16 DIAGNOSIS — I10 Essential (primary) hypertension: Secondary | ICD-10-CM

## 2024-09-16 DIAGNOSIS — H35033 Hypertensive retinopathy, bilateral: Secondary | ICD-10-CM
